# Patient Record
Sex: Female | Born: 1945 | Race: White | Hispanic: No | Smoking: Current some day smoker
Health system: Southern US, Community
[De-identification: ages and names within clinical notes are randomized; demographics above are authoritative.]

## PROBLEM LIST (undated history)

## (undated) DIAGNOSIS — D72829 Elevated white blood cell count, unspecified: Secondary | ICD-10-CM

## (undated) DIAGNOSIS — D62 Acute posthemorrhagic anemia: Secondary | ICD-10-CM

## (undated) DIAGNOSIS — S72142A Displaced intertrochanteric fracture of left femur, initial encounter for closed fracture: Secondary | ICD-10-CM

## (undated) DIAGNOSIS — N390 Urinary tract infection, site not specified: Secondary | ICD-10-CM

## (undated) DIAGNOSIS — J449 Chronic obstructive pulmonary disease, unspecified: Secondary | ICD-10-CM

## (undated) DIAGNOSIS — R918 Other nonspecific abnormal finding of lung field: Secondary | ICD-10-CM

## (undated) DIAGNOSIS — R000 Tachycardia, unspecified: Secondary | ICD-10-CM

## (undated) HISTORY — DX: Chronic obstructive pulmonary disease, unspecified: J44.9

## (undated) HISTORY — DX: Displaced intertrochanteric fracture of left femur, initial encounter for closed fracture: S72.142A

## (undated) HISTORY — DX: Urinary tract infection, site not specified: N39.0

## (undated) HISTORY — DX: Acute posthemorrhagic anemia: D62

## (undated) HISTORY — DX: Tachycardia, unspecified: R00.0

## (undated) HISTORY — DX: Elevated white blood cell count, unspecified: D72.829

---

## 1984-11-15 HISTORY — PX: TUBAL LIGATION: SHX77

## 1998-02-27 ENCOUNTER — Emergency Department (HOSPITAL_COMMUNITY): Admission: EM | Admit: 1998-02-27 | Discharge: 1998-02-27 | Payer: Self-pay | Admitting: Emergency Medicine

## 1998-03-17 ENCOUNTER — Emergency Department (HOSPITAL_COMMUNITY): Admission: EM | Admit: 1998-03-17 | Discharge: 1998-03-17 | Payer: Self-pay | Admitting: *Deleted

## 2001-02-24 ENCOUNTER — Encounter: Payer: Self-pay | Admitting: Family Medicine

## 2001-02-24 ENCOUNTER — Ambulatory Visit (HOSPITAL_COMMUNITY): Admission: RE | Admit: 2001-02-24 | Discharge: 2001-02-24 | Payer: Self-pay | Admitting: Family Medicine

## 2006-12-25 ENCOUNTER — Emergency Department (HOSPITAL_COMMUNITY): Admission: EM | Admit: 2006-12-25 | Discharge: 2006-12-25 | Payer: Self-pay | Admitting: Emergency Medicine

## 2011-10-19 ENCOUNTER — Other Ambulatory Visit: Payer: Self-pay | Admitting: Family Medicine

## 2011-10-19 DIAGNOSIS — R05 Cough: Secondary | ICD-10-CM

## 2011-10-20 ENCOUNTER — Ambulatory Visit
Admission: RE | Admit: 2011-10-20 | Discharge: 2011-10-20 | Disposition: A | Discharge status: Home / Self Care | Payer: Medicare Other | Source: Ambulatory Visit | Attending: Family Medicine | Admitting: Family Medicine

## 2011-10-20 DIAGNOSIS — R05 Cough: Secondary | ICD-10-CM

## 2011-10-20 MED ORDER — IOHEXOL 300 MG/ML  SOLN
75.0000 mL | Freq: Once | INTRAMUSCULAR | Status: AC | PRN
  Administered 2011-10-20: 75 mL via INTRAVENOUS

## 2011-10-28 ENCOUNTER — Other Ambulatory Visit: Payer: Self-pay

## 2011-10-28 ENCOUNTER — Encounter: Payer: Self-pay | Admitting: Internal Medicine

## 2011-10-28 ENCOUNTER — Encounter (HOSPITAL_COMMUNITY): Payer: Self-pay | Admitting: *Deleted

## 2011-10-28 ENCOUNTER — Emergency Department (HOSPITAL_COMMUNITY)
Admission: EM | Admit: 2011-10-28 | Discharge: 2011-10-28 | Disposition: A | Discharge status: Home / Self Care | Payer: Medicare Other | Attending: Emergency Medicine | Admitting: Emergency Medicine

## 2011-10-28 ENCOUNTER — Ambulatory Visit (INDEPENDENT_AMBULATORY_CARE_PROVIDER_SITE_OTHER): Payer: Medicare Other | Admitting: Internal Medicine

## 2011-10-28 VITALS — BP 110/62 | HR 103 | Temp 98.5°F | Ht 68.0 in | Wt 109.8 lb

## 2011-10-28 DIAGNOSIS — F17200 Nicotine dependence, unspecified, uncomplicated: Secondary | ICD-10-CM

## 2011-10-28 DIAGNOSIS — R64 Cachexia: Secondary | ICD-10-CM

## 2011-10-28 DIAGNOSIS — R0989 Other specified symptoms and signs involving the circulatory and respiratory systems: Secondary | ICD-10-CM

## 2011-10-28 DIAGNOSIS — R918 Other nonspecific abnormal finding of lung field: Secondary | ICD-10-CM

## 2011-10-28 DIAGNOSIS — J4489 Other specified chronic obstructive pulmonary disease: Secondary | ICD-10-CM | POA: Insufficient documentation

## 2011-10-28 DIAGNOSIS — R0600 Dyspnea, unspecified: Secondary | ICD-10-CM

## 2011-10-28 DIAGNOSIS — F411 Generalized anxiety disorder: Secondary | ICD-10-CM | POA: Insufficient documentation

## 2011-10-28 DIAGNOSIS — R222 Localized swelling, mass and lump, trunk: Secondary | ICD-10-CM

## 2011-10-28 DIAGNOSIS — R062 Wheezing: Secondary | ICD-10-CM | POA: Insufficient documentation

## 2011-10-28 DIAGNOSIS — R05 Cough: Secondary | ICD-10-CM

## 2011-10-28 DIAGNOSIS — R0789 Other chest pain: Secondary | ICD-10-CM

## 2011-10-28 DIAGNOSIS — R000 Tachycardia, unspecified: Secondary | ICD-10-CM

## 2011-10-28 DIAGNOSIS — F419 Anxiety disorder, unspecified: Secondary | ICD-10-CM

## 2011-10-28 DIAGNOSIS — J449 Chronic obstructive pulmonary disease, unspecified: Secondary | ICD-10-CM | POA: Insufficient documentation

## 2011-10-28 NOTE — ED Notes (Signed)
Report received from [facility], "pt was @ [facility] across the [address], had increased HR d/t albuterol, was informed by MD she may have cancer"

## 2011-10-28 NOTE — Patient Instructions (Addendum)
#  Chest lump  - please have PET scan asap  - will call daughter or sister with rsults and discuss next step #Cough and shortness of breath  - have breathing test full PFT at [facility] or [place] [facility] [place] as[facility] - have walking oxygen test today #Rapid heart beat due to albuterol - will review breathing test and then we can discuss med change at followup visit #Smoking  - will discuss at followup #Followup  - will call with results to discuss

## 2011-10-28 NOTE — ED Notes (Signed)
ZOX:[id number]<AV> Expected date:10/28/11<BR> Expected time: 4:10 PM<BR> Means of arrival:Ambulance<BR> Comments:<BR> EMS 51 GC, anxiety tachycardia

## 2011-10-28 NOTE — Progress Notes (Signed)
Subjective:    Patient ID: Karen Coffey, female    DOB: September 10, 1946, 65 y.o.   MRN: [id number]  HPI 65 year old female.  SMoker.  Aware of copd diagnosis since 2008 but not on Rx or fu for same.  No regular PMD. REferred by [facility] urgent care. Baseline chronic cough  Started having left infrascapular chest pains x 1 month. Improved by sitting up straight. Normally aching and mild when sitting still but radomly can get worse and become throbbing. Made worse when she lies down on the side. No relationship with inspiration or breathing. Severity is 2-3 of 10. Associated sensation of something moving around since starting levoflox and prednisone 10 days ago. Has completed course.   Chronic cough for several years is unchanged. RSI score is 17. [Has level for annoying cough. Has level III hoarseness of voice clearing of throat, coughing and lying down. Has level I excess mucus drip, difficulty swallowing liquids or tablets, choking episodes, and sensation of something in throat, and level 0 of  Heartburn]. Assoicated mild white spiutum +. Sputum gets more when she uses inhaler that was started recently. Associated weight loss + ? 20#. Body mass index is 16.70 kg/(m^2).   Above symptoms went to urgent care and resulted in CT chest 10/20/11 : that showed Low left internal jugular lymph node measures 9 mm. Left perihilar mass measures 8.2 x 6.2 cm and is contiguous with bulky adenopathy in the AP window and prevascular space. 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 Heart size  normal. No pericardial effusion. Emphysema. There is obstruction of the left upper lobe bronchus,  with postobstructive pneumonitis and consolidation. Pleural nodularity is seen in the posterior left hemithorax. Scarring in the posterior right upper lobe. No pleural fluid.  She is aware this is tumor and says she does not want to know what it is but  says "I do not want to think that or the worst". When I prompted her, she said "it is the C word". She says she does not want to see the image. She prefers daughter [name] and sister [name] be given diagnosis after biopsy. She clearly does not want to know prognosis. Displaced extreme anxiety about testing even PET scan  SOCIAL Daughter [name] and sister [name] will be first lines of communication. She is close to her son [name] who was in [place]. This another daughter [name] who is not in communication with family for 2 years.  Past Medical History  Diagnosis Date  . COPD (chronic obstructive pulmonary disease)   . Pleurisy 2008    left lung     Family History  Problem Relation Age of Onset  . Asthma    . Heart disease Mother   . Cancer Father   . Pancreatic cancer       History   Social History  . Marital Status: Divorced    Spouse Name: N/A    Number of Children: N/A  . Years of Education: N/A   Occupational History  . Not on file.   Social History Main Topics  . Smoking status: Current Everyday Smoker -- 1.0 packs/day for 50 years    Types: Cigarettes  . Smokeless tobacco: Never Used   Comment: currently down to 7 cigs a day  . Alcohol Use: No  . Drug Use: No  . Sexually Active: Not on file   Other Topics Concern  . Not on file   Social  History Narrative  . No narrative on file     No Known Allergies   No outpatient prescriptions prior to visit.     Review of Systems  Constitutional: Positive for appetite change and unexpected weight change. Negative for fever.  HENT: Positive for congestion and trouble swallowing. Negative for ear pain, nosebleeds, sore throat, rhinorrhea, sneezing, dental problem, postnasal drip and sinus pressure.   Eyes: Negative for redness and itching.  Respiratory: Positive for cough and shortness of breath. Negative for chest tightness and wheezing.   Cardiovascular: Positive for palpitations. Negative for leg swelling.    Gastrointestinal: Negative for nausea and vomiting.  Genitourinary: Negative for dysuria.  Musculoskeletal: Negative for joint swelling.  Skin: Negative for rash.  Neurological: Negative for headaches.  Hematological: Does not bruise/bleed easily.  Psychiatric/Behavioral: Positive for dysphoric mood. The patient is nervous/anxious.        Objective:   Physical Exam  Vitals reviewed. Constitutional: She is oriented to person, place, and time. She appears well-nourished. No distress.       Body mass index is 16.70 kg/(m^2).   HENT:  Head: Normocephalic and atraumatic.  Right Ear: External ear normal.  Left Ear: External ear normal.  Mouth/Throat: Oropharynx is clear and moist. No oropharyngeal exudate.  Eyes: Conjunctivae and EOM are normal. Pupils are equal, round, and reactive to light. Right eye exhibits no discharge. Left eye exhibits no discharge. No scleral icterus.  Neck: Normal range of motion. Neck supple. No JVD present. No tracheal deviation present. No thyromegaly present.  Cardiovascular: Normal rate, regular rhythm, normal heart sounds and intact distal pulses.  Exam reveals no gallop and no friction rub.   No murmur heard.      Got tachycardic to 160 after visit while gtting pet scan organized and sent to ER  Pulmonary/Chest: Effort normal and breath sounds normal. No respiratory distress. She has no wheezes. She has no rales. She exhibits no tenderness.  Abdominal: Soft. Bowel sounds are normal. She exhibits no distension and no mass. There is no tenderness. There is no rebound and no guarding.  Musculoskeletal: Normal range of motion. She exhibits no edema and no tenderness.  Lymphadenopathy:    She has no cervical adenopathy.  Neurological: She is alert and oriented to person, place, and time. She has normal reflexes. No cranial nerve deficit. She exhibits normal muscle tone. Coordination normal.  Skin: Skin is warm and dry. No rash noted. She is not diaphoretic. No  erythema. No pallor.  Psychiatric: Judgment and thought content normal.       Highly anxious          Assessment & Plan:

## 2011-10-29 ENCOUNTER — Telehealth: Payer: Self-pay | Admitting: Internal Medicine

## 2011-10-29 MED ORDER — LEVALBUTEROL TARTRATE 45 MCG/ACT IN AERO: 1.0000 | INHALATION_SPRAY | RESPIRATORY_TRACT | Status: DC | PRN

## 2011-10-29 MED ORDER — ALPRAZOLAM 0.25 MG PO TABS: ORAL_TABLET | ORAL | Status: DC

## 2011-10-29 MED ORDER — ZOLPIDEM TARTRATE 5 MG PO TABS: ORAL_TABLET | ORAL | Status: DC

## 2011-10-29 NOTE — ED Provider Notes (Signed)
Medical screening examination/treatment/procedure(s) were performed by non-physician practitioner and as supervising physician I was immediately available for consultation/collaboration.   Daveon Arpino A. Patrica Duel, MD 10/29/11 [phone]

## 2011-10-29 NOTE — Telephone Encounter (Signed)
Daughter called and stated that the pt requested that she call---pt stated that she is having severe pain in her back--requesting something of a low dose.  Pt was unable to sleep last night due to the pain--pain is worse at night.  She was given muscle relaxer but this does not help.   Pt is also afraid of having the PET scan next week--she is afraid that she will have to be in the scanner for a long time and this just makes the pt freak out.  Daughter is wanting to know if the pt can have something to take prior to the PET scan to calm her down. MR   Please advise. thanks

## 2011-10-29 NOTE — Telephone Encounter (Signed)
Ok if opioids have tendency to make her loopy let her know that ibuprofen 400-600mg  three times daily prn .  And for stomach irritiation associated with that take OTC prilosec 20mg  with it. There is bony and possibly nerve root irritation so need something strong. If this does not work then call back and next several days we can try neurotin which takes days to work

## 2011-10-29 NOTE — Telephone Encounter (Signed)
Called and spoke with Karen Coffey and she is aware of MR recs at this time. Will try the ibuprofen 400-600mg  tid prn and take the prilosec 20mg .  She is aware to call back if this does not seem to be helping and we can try the neurontin.

## 2011-10-29 NOTE — ED Provider Notes (Signed)
History     CSN: [id number] Arrival date & time: 10/28/2011  4:19 PM   First MD Initiated Contact with Patient 10/28/11 1759      Chief Complaint  Patient presents with  . Tachycardia    pt was at drs office and pts HR went to 168. Pt reports that she has been on albuterol. pt denies other complaints.     (Consider location/radiation/quality/duration/timing/severity/associated sxs/prior treatment) Patient is a 65 y.o. female presenting with palpitations. The history is provided by the patient.  Palpitations  The current episode started 1 to 2 hours ago. The problem occurs constantly. Pertinent negatives include no diaphoresis, no fever, no chest pain, no irregular heartbeat, no dizziness and no cough.  Pt states she was at her pulmonologist's office when they were takling about getting a PET scan to evaluate for cancer. States that she became very anxious because she was worried about her having cancer and because she is claustrophobic and does not want to be in a scanner. States they noted her to have HR in 160s, and it did not come down in 20 min so she was transported to ER by ambulance. States by the time she got here, her symptoms resolved. She denied chest pain, SOB, dizziness at any point during event. No current complaints.  Past Medical History  Diagnosis Date  . COPD (chronic obstructive pulmonary disease)   . Pleurisy 2008    left lung    Past Surgical History  Procedure Date  . Tubal ligation 1986    Family History  Problem Relation Age of Onset  . Asthma    . Heart disease Mother   . Cancer Father   . Pancreatic cancer      History  Substance Use Topics  . Smoking status: Current Everyday Smoker -- 1.0 packs/day for 50 years    Types: Cigarettes  . Smokeless tobacco: Never Used   Comment: currently down to 7 cigs a day  . Alcohol Use: No    OB History    Grav Para Term Preterm Abortions TAB SAB Ect Mult Living                  Review of Systems    Constitutional: Negative for fever and diaphoresis.  HENT: Negative.   Respiratory: Negative for cough and chest tightness.   Cardiovascular: Positive for palpitations. Negative for chest pain and leg swelling.  Gastrointestinal: Negative.   Genitourinary: Negative.   Musculoskeletal: Negative.   Skin: Negative.   Neurological: Negative.  Negative for dizziness.  Psychiatric/Behavioral: Negative.     Allergies  Review of patient's allergies indicates no known allergies.  Home Medications   Current Outpatient Rx  Name Route Sig Dispense Refill  . ALBUTEROL SULFATE HFA 108 (90 BASE) MCG/ACT IN AERS Inhalation Inhale 2 puffs into the lungs every 6 (six) hours as needed. For shortness of breath.    . ALBUTEROL SULFATE (2.5 MG/3ML) 0.083% IN NEBU Nebulization Take 2.5 mg by nebulization every 6 (six) hours as needed. For shortness of breath.      BP 122/62  Pulse 97  Temp(Src) 97.9 F (36.6 C) (Oral)  Resp 18  Wt 109 lb (49.442 kg)  SpO2 96%  Physical Exam  Nursing note and vitals reviewed. Constitutional: She is oriented to person, place, and time. She appears well-developed and well-nourished. No distress.  HENT:  Head: Normocephalic and atraumatic.  Eyes: Conjunctivae are normal.  Neck: Neck supple.  Cardiovascular: Normal rate, regular rhythm and normal  heart sounds.   Pulmonary/Chest: Effort normal. No respiratory distress. She has wheezes. She has rales.  Abdominal: Soft. There is no tenderness.  Musculoskeletal: Normal range of motion. She exhibits no edema.  Neurological: She is alert and oriented to person, place, and time.  Skin: Skin is warm and dry. She is not diaphoretic. No erythema.  Psychiatric: She has a normal mood and affect.    ED Course  Procedures (including critical care time)  Pt with tachycardia which is now resolved, HR here 99. Pt has no complaints. Pt is here with her daughter, who both state pt was very anxious. Pt states she is feeling  better and does not want to stay for testing. ECG ordered, which shows NS. Pt asked to be discharged. Will d/c home. Explained to her risks of leaving without any blood tests or any other evaluation and monitoring. Pt voiced her understanding and states she would return if symptoms return. Will d/c home  1. Tachycardia   2. Anxiety      Date: 10/29/2011  Rate: 103  Rhythm: sinus tachycardia  QRS Axis: right  Intervals: normal  ST/T Wave abnormalities: nonspecific T wave changes  Conduction Disutrbances:none  Narrative Interpretation:   Old EKG Reviewed: changes noted new tachycarida    MDM          Lottie Mussel, PA 10/29/11 0216  Lottie Mussel, PA 10/29/11 1610

## 2011-10-29 NOTE — Telephone Encounter (Signed)
Please tell her not to take albuterol. Send xopenex script mdi  X 1. IF expensive let us know. This is for tachycardia  For pain: I thought she has percocet ? Not sure. LEt me know what she is taking for pain. Will definitely need an opioid. Let me know  Regarding pet scan anxiety: take ambien 5mg  night before PET scan and xanax 0.25mg   X 1on morning while going to PET scan

## 2011-10-29 NOTE — Telephone Encounter (Signed)
Daughter is aware and says her mother does not have anything for pain. She prefers not to use Percocet but will try something lose dose for pain. These type of medications make her loopy.

## 2011-11-01 ENCOUNTER — Ambulatory Visit (HOSPITAL_COMMUNITY)
Admission: RE | Admit: 2011-11-01 | Discharge: 2011-11-01 | Disposition: A | Discharge status: Home / Self Care | Payer: Medicare Other | Source: Ambulatory Visit | Attending: Internal Medicine | Admitting: Internal Medicine

## 2011-11-01 ENCOUNTER — Telehealth: Payer: Self-pay | Admitting: Internal Medicine

## 2011-11-01 DIAGNOSIS — R222 Localized swelling, mass and lump, trunk: Secondary | ICD-10-CM | POA: Insufficient documentation

## 2011-11-01 DIAGNOSIS — F17200 Nicotine dependence, unspecified, uncomplicated: Secondary | ICD-10-CM | POA: Insufficient documentation

## 2011-11-01 DIAGNOSIS — J988 Other specified respiratory disorders: Secondary | ICD-10-CM | POA: Insufficient documentation

## 2011-11-01 DIAGNOSIS — R918 Other nonspecific abnormal finding of lung field: Secondary | ICD-10-CM

## 2011-11-01 LAB — PULMONARY FUNCTION TEST

## 2011-11-01 MED ORDER — GABAPENTIN 300 MG PO CAPS: ORAL_CAPSULE | ORAL | Status: DC

## 2011-11-01 MED ORDER — TRAMADOL HCL 50 MG PO TABS: 50.0000 mg | ORAL_TABLET | Freq: Three times a day (TID) | ORAL | Status: AC | PRN

## 2011-11-01 NOTE — Telephone Encounter (Signed)
I spoke with Karen Coffey and she states that pt tried the ibuprofen and it did not help with her back pain and is requesting an alternative. I called MR and he stated 1) offer neurotin 300 mg: 300 mg qd x 3 days, 300 mg BID x 3 days, 300 mg TID  -Can make you sleepy and it takes a few days for it to act.  2) tramadol 50 mg 1 q 8 hrs 3) Also advise them w/o opoid's it will be difficult to control her pain and to consider this.  I advised Karen Coffey of MR's recs. She voiced her understanding and states they will try the tramadol and neurotin to see if this will help with her pain. She would like rx's sent to [facility] on [address]. Rx's have been sent and Karen Coffey is aware of the directions and voiced her understanding and had no questions.

## 2011-11-02 ENCOUNTER — Encounter: Payer: Self-pay | Admitting: Internal Medicine

## 2011-11-02 DIAGNOSIS — F17200 Nicotine dependence, unspecified, uncomplicated: Secondary | ICD-10-CM | POA: Insufficient documentation

## 2011-11-02 DIAGNOSIS — R918 Other nonspecific abnormal finding of lung field: Secondary | ICD-10-CM | POA: Insufficient documentation

## 2011-11-02 DIAGNOSIS — R0600 Dyspnea, unspecified: Secondary | ICD-10-CM | POA: Insufficient documentation

## 2011-11-02 DIAGNOSIS — R0789 Other chest pain: Secondary | ICD-10-CM | POA: Insufficient documentation

## 2011-11-02 DIAGNOSIS — R64 Cachexia: Secondary | ICD-10-CM | POA: Insufficient documentation

## 2011-11-02 DIAGNOSIS — R000 Tachycardia, unspecified: Secondary | ICD-10-CM | POA: Insufficient documentation

## 2011-11-02 DIAGNOSIS — R05 Cough: Secondary | ICD-10-CM | POA: Insufficient documentation

## 2011-11-02 NOTE — Assessment & Plan Note (Signed)
Due to COPD and lung mass. We'll do PFTs 

## 2011-11-02 NOTE — Assessment & Plan Note (Signed)
This is a bad prognostic sign and suspected lung cancer and COPD because her body mass index is less than 17

## 2011-11-02 NOTE — Assessment & Plan Note (Signed)
She acknowledges the need to quit smoking. We'll address in detail at followup right now brief counseling given

## 2011-11-02 NOTE — Assessment & Plan Note (Signed)
Due to COPD and lung mass. We'll do PFTs

## 2011-11-02 NOTE — Assessment & Plan Note (Signed)
This is due to the tumor.  try treating this with nonsteroidal anti-inflammatory drugs initially

## 2011-11-02 NOTE — Assessment & Plan Note (Signed)
Initially suspected due to albuterol. At the end of the visit also scheduling a PET scan her heart rate rose to 160 following a "panic attack". It remained sustained at 160 per minute for at least 10 minutes in the office. We gave her a baby aspirin. Called emergency services and sent her to [facility] emergency room

## 2011-11-02 NOTE — Assessment & Plan Note (Signed)
#  Chest lump  - please have PET scan asap  - will call daughter or sister with rsults and discuss next step

## 2011-11-04 ENCOUNTER — Encounter (HOSPITAL_COMMUNITY): Payer: Self-pay

## 2011-11-04 ENCOUNTER — Encounter (HOSPITAL_COMMUNITY)
Admission: RE | Admit: 2011-11-04 | Discharge: 2011-11-04 | Disposition: A | Discharge status: Home / Self Care | Payer: Medicare Other | Source: Ambulatory Visit | Attending: Internal Medicine | Admitting: Internal Medicine

## 2011-11-04 DIAGNOSIS — R918 Other nonspecific abnormal finding of lung field: Secondary | ICD-10-CM

## 2011-11-04 DIAGNOSIS — R222 Localized swelling, mass and lump, trunk: Secondary | ICD-10-CM | POA: Insufficient documentation

## 2011-11-04 DIAGNOSIS — J984 Other disorders of lung: Secondary | ICD-10-CM | POA: Insufficient documentation

## 2011-11-04 HISTORY — DX: Other nonspecific abnormal finding of lung field: R91.8

## 2011-11-04 LAB — GLUCOSE, CAPILLARY: Glucose-Capillary: 104 mg/dL — ABNORMAL HIGH (ref 70–99)

## 2011-11-04 MED ORDER — FLUDEOXYGLUCOSE F - 18 (FDG) INJECTION
19.9000 | Freq: Once | INTRAVENOUS | Status: AC | PRN
  Administered 2011-11-04: 19.9 via INTRAVENOUS

## 2011-11-05 ENCOUNTER — Telehealth: Payer: Self-pay | Admitting: Internal Medicine

## 2011-11-05 DIAGNOSIS — R0600 Dyspnea, unspecified: Secondary | ICD-10-CM

## 2011-11-05 DIAGNOSIS — R918 Other nonspecific abnormal finding of lung field: Secondary | ICD-10-CM

## 2011-11-05 NOTE — Telephone Encounter (Signed)
I spoke with MR about this patient and he states is going to speak witht he radiologist to see which place they are going to biopsy and he will call the family later today. He has not reviewed ONO results yet. I have LMTCBx1 to advise the pt family. Carron Curie, CMA

## 2011-11-05 NOTE — Telephone Encounter (Signed)
REviewe PET scan: there is activity at many places. So, many places that can be biopsied. So, spoke to Dr Grace Isaac of IR. He feels suprclavicular node is best place to biopsy especially left side (see report below). Called dtr Glendon Axe had to [facility]. Called patient [phone] but no response. Called patient on both her number but no response. LMTCB  Please set up for node bx and fu with me. She will ask something for anxiety and you can give the same thing given for PET scan which I think was the ambien night before and xanax 1h before  For O2, pls check my desk and maybe have an MD review or she has to wait until after xmas. I am inbox xmas day and if quiet will be reviewing   Nm Pet Image Initial (pi) Skull Base To Thigh  11/04/2011  *RADIOLOGY REPORT*  Clinical Data:  Initial treatment strategy for lung mass.  NUCLEAR MEDICINE PET CT INITIAL (PI) SKULL BASE TO THIGH  Technique:  19.9 mCi F-18 FDG was injected intravenously via the left antecubital fossa.  Full-ring PET imaging was performed from the skull base through the mid-thighs 75  minutes after injection. CT data was obtained and used for attenuation correction and anatomic localization only.  (This was not acquired as a diagnostic CT examination.)  Fasting Blood Glucose:  104  Patient Weight:  109 pounds.  Comparison:  CT chest 10/20/2011 and 12/25/2006.  Findings: Low left internal jugular adenopathy measures up to 1.5 cm with an S U V max of 11.4.  There are two hypermetabolic left axillary lymph nodes, measuring up to 12 mm (previously 7 mm). Prevascular nodal mass is contiguous with a large left upper lobe mass.  S U V max is 13.4.  Large left upper lobe mass is better measured on 10/20/2011 and has an S U V max of 14.2.  There are pleural nodules in the left hemithorax, with an S U V max of 8.0. No additional areas of abnormal hypermetabolism in the neck, chest, abdomen or pelvis.  CT images were performed for attenuation correction.  No  additional acute findings in the neck. Interpretation of CT images of the chest will be deferred to diagnostic examination performed 10/20/2011.  No intervening pericardial or pleural effusion.  No acute findings in the abdomen or pelvis.  IMPRESSION: Hypermetabolic large left upper lobe mass with associated pleural nodularity, mediastinal invasion and adenopathy extending to the left supraclavicular region, most consistent with primary bronchogenic carcinoma.  If this is a non-small cell lung cancer, it is most consistent with [id number] or stage IV disease.  Original Report Authenticated By: Reyes Ivan, M.D.

## 2011-11-05 NOTE — Telephone Encounter (Signed)
Long 20 mnutes conversatiion with dtr.    - Lung mass: she and her mom anxious. Lot of social pressure on "why so long" and why biopsy when it already likely cancer. Explained all of above. AFter that she does not want outside referral to [facility]. She will pursue care here though I offered her to refer to [facility]. She understand rationale for bx.   Please set  Up the supraclav bx of PET HOT LEFT lyymph node ASAP  - OXYGEN: waking up dyspneic. They want o2 asap,. Please call me now or page me with the ONO result and we will do order so they can have o2 asap

## 2011-11-05 NOTE — Telephone Encounter (Signed)
I called and spoke with MR and gave ONO results. Per ONO report Saturation time less then or equal to 88% is 1.81mins so per medicare guidelines the pt does not qualify for oxygen at night time. She does however qualify for oxygen with exertion per walk test done at time of OV.  I called and relayed this to the pt daughter and she is ok to order this. I placed order for o2 with exertion and biopsy order as well. Carron Curie, CMA

## 2011-11-05 NOTE — Telephone Encounter (Signed)
Pt daughter called back and I advised per below and she had more questions so I advised I will have MR call the daughter back. Carron Curie, CMA

## 2011-11-10 ENCOUNTER — Telehealth: Payer: Self-pay | Admitting: Internal Medicine

## 2011-11-10 NOTE — Telephone Encounter (Signed)
lmomtcb for angel

## 2011-11-10 NOTE — Telephone Encounter (Signed)
Pt's daughter says that the pt cancelled the order for portable O2 with [facility] and did not mean to do this. I spoke [place] at [facility] and they will need [place] to refax the order because they did not receive this. Order refaxed to [phone].

## 2011-11-12 ENCOUNTER — Other Ambulatory Visit: Payer: Self-pay | Admitting: Radiology

## 2011-11-12 ENCOUNTER — Telehealth: Payer: Self-pay | Admitting: Internal Medicine

## 2011-11-12 NOTE — Telephone Encounter (Signed)
Needs complete evaluation - to er

## 2011-11-12 NOTE — Telephone Encounter (Signed)
Spoke with [employer] and notified of recs per RA. She verbalized understanding and will inform the pt.

## 2011-11-12 NOTE — Telephone Encounter (Signed)
Called spoke with patient's daughter, [name] who asked if patient has any refills on her xopenex hfa.  Refilled #1 with 1 refill on 12.14.12 - asked if patient needs the refill already?  Yes per [name] > pt's o2 was not delivered until yesterday and has been having DOE to the point where patient passed out yesterday evening walking back from her bedroom.  [name] stated that pt returned to the living room, asked for her o2 (was written for exertion 2L), sat in the chair and her head lolled back, eyes rolled into the back of her head.  Per [name], they began mouth to mouth (pt's heart did not stop per [name]) and EMS was called.  Pt came to shortly after, and by the time EMS arrived all her VS were normal including her BP, BS, and o2 sats.  When EMS offered to take pt to the ED she declined.  Per [name], pt wore her o2 last night and all day today and reported that she does feel better today but still feels "off."  I advised [name] that if patient's SOB is to that point she should take patient to the ER or at least an UC but [name] is requesting to increase pt's o2 liter flow and any additional recs.  Advised her to have patient stay seated/resting, wear the o2 continuously and to have her do pursed-lip breathing.    Dr Vassie Loll please advise, thanks.

## 2011-11-13 ENCOUNTER — Telehealth: Payer: Self-pay | Admitting: Pulmonary Disease

## 2011-11-13 NOTE — Telephone Encounter (Signed)
Called by pt's daughter requesting refills on pt's albuterol neb solution.  Pt was taken off this by Dr. Marchelle Gearing, and placed on xopenex hfa for rescue due to tachy.  I have reviewed records, and there was also a phone call 12/28 requesting xopenex refills in a short period of time.  She was felt to be overusing her rescue med, and felt she needed evaluation in ER.  I told the daughter today that I would not fill her albuterol neb solution, and that if the pt was having a lot of issues with breathing, she needed to go to ER for evaluation.  Daughter became upset, and when she called me back again called me an "asshole" and hung up.

## 2011-11-15 ENCOUNTER — Telehealth: Payer: Self-pay | Admitting: Internal Medicine

## 2011-11-15 NOTE — Telephone Encounter (Signed)
LMOM TCB x1.  WCB d/t content of the message.

## 2011-11-15 NOTE — Telephone Encounter (Signed)
PT'S DAUGHTER ANGEL CALLED BACK AGAIN- SAYS IT'S "VERY IMPORTANT" THAT NURSE CALL BACK ASAP. Hazel Sams

## 2011-11-15 NOTE — Telephone Encounter (Signed)
Called spoke with [name] who stated that pt is in some extreme pain in her rib cage and back on the left side x5days, progressively getting worse.  Per 12.17.12 phone note, has been taking gabapentin as directed and the tramadol "made her hurt more."  Has been using ice packs that offer slight relief.  Per the 12.17.12, MR offered opoids, percocet per [name] > she prefer pt not take this but is okay with it if "it's the only thing that will work."  Pt is weak and only getting up to use the bathroom.  I did advise [name] that if pt is in that much pain then the ER is the best option for her.  Angel informed me that the patient does not want to go to the hosp for fear that "they will want to keep her there and she'll die there."  Advised that I will be happy to call the patient and speak with her to encourage her that the ER is best right now, but [name] wants a physicians recs first.  Called MR, advised of pt's status per [place].  Per MR, the best option is to have an admission for pain management at [facility].  Pt's biopsy is scheduled for 1.2.13 - why was it scheduled for 2 weeks out?  Please find out.  Called spoke with [name], advised of MR's recs.  [name] verbalized her understanding.  I asked for pt's number to speak with her directly > [phone].  Called spoke with patient who reported that her pain "is not that bad."  Pt stated that she has sporadic "throbbing" in her back and that it happens occasionally after taking the tramadol.  I asked pt to rate this pain on a scale of 0-10 but pt stated that she can't do that, the pain is an "aggravating pain."  Pt reported that ice and heat help and that she is actually feeling better yesterday and today.  Has been drinking ensure, getting up and sitting in front of the door/window looking outside and getting up to eat.  Pt stated that she told her daughter that she knows what to do if the pain gets severe > "call 911 and get them to come and get her."  I advised pt  that yes, the ER would be the best place for her to help her with her pain management.  Pt stated that she did opt to have her biopsy on 1.2.13, that date was offered to her.  Spoke with MR, informed him of my findings.  MR okay with this - will speak with another practitioner in office about giving pt biopsy results so that she will not have to wait another week for an appt with him.  Spoke with [name], informed her of my conversation with her mother and that MR is aware of such.  [name] stated that she feels like the patient is down-playing her symptoms and that she vacillates from the extreme pain to "it doesn't hurt that bad."  Dorris Carnes that if the pain becomes extreme again to encourage pt to go to the ER and remind her of her conversation with me and MR's recs.  [name] was okay with this and verbalized her understanding.  I also informed [name] that we will try to get pt in this week to discuss her biopsy results so that she will not have to wait another week for the results - [name] okay with this as well and verbalized her understanding.

## 2011-11-17 ENCOUNTER — Ambulatory Visit (HOSPITAL_COMMUNITY)
Admission: RE | Admit: 2011-11-17 | Discharge: 2011-11-17 | Disposition: A | Discharge status: Home / Self Care | Payer: Medicare Other | Source: Ambulatory Visit | Attending: Internal Medicine | Admitting: Internal Medicine

## 2011-11-17 ENCOUNTER — Telehealth: Payer: Self-pay | Admitting: Internal Medicine

## 2011-11-17 DIAGNOSIS — F17200 Nicotine dependence, unspecified, uncomplicated: Secondary | ICD-10-CM | POA: Insufficient documentation

## 2011-11-17 DIAGNOSIS — J4489 Other specified chronic obstructive pulmonary disease: Secondary | ICD-10-CM | POA: Insufficient documentation

## 2011-11-17 DIAGNOSIS — R222 Localized swelling, mass and lump, trunk: Secondary | ICD-10-CM | POA: Insufficient documentation

## 2011-11-17 DIAGNOSIS — R599 Enlarged lymph nodes, unspecified: Secondary | ICD-10-CM | POA: Insufficient documentation

## 2011-11-17 DIAGNOSIS — J449 Chronic obstructive pulmonary disease, unspecified: Secondary | ICD-10-CM | POA: Insufficient documentation

## 2011-11-17 DIAGNOSIS — R918 Other nonspecific abnormal finding of lung field: Secondary | ICD-10-CM

## 2011-11-17 LAB — APTT: aPTT: 32 seconds (ref 24–37)

## 2011-11-17 LAB — PROTIME-INR
INR: 0.98 (ref 0.00–1.49)
Prothrombin Time: 13.2 seconds (ref 11.6–15.2)

## 2011-11-17 LAB — CBC
MCH: 29.6 pg (ref 26.0–34.0)
MCHC: 34.8 g/dL (ref 30.0–36.0)
Platelets: 413 10*3/uL — ABNORMAL HIGH (ref 150–400)
RDW: 12.2 % (ref 11.5–15.5)

## 2011-11-17 MED ORDER — FENTANYL CITRATE 0.05 MG/ML IJ SOLN
INTRAMUSCULAR | Status: AC
  Filled 2011-11-17: qty 4

## 2011-11-17 MED ORDER — SODIUM CHLORIDE 0.9 % IV SOLN: INTRAVENOUS | Status: DC

## 2011-11-17 MED ORDER — MIDAZOLAM HCL 2 MG/2ML IJ SOLN
INTRAMUSCULAR | Status: AC
  Filled 2011-11-17: qty 4

## 2011-11-17 NOTE — H&P (Signed)
Karen Coffey is an 66 y.o. female.   Chief Complaint: " I' m here for a biopsy" HPI: Patient with history of left lung mass with left supraclavicular adenopathy presents today for elective US guided left supraclavicular lymph node biopsy.  Past Medical History  Diagnosis Date  . COPD (chronic obstructive pulmonary disease)   . Pleurisy 2008    left lung  . Lung mass     Past Surgical History  Procedure Date  . Tubal ligation 1986    Family History  Problem Relation Age of Onset  . Asthma    . Heart disease Mother   . Cancer Father   . Pancreatic cancer     Social History:  reports that she has been smoking Cigarettes.  She has a 50 pack-year smoking history. She has never used smokeless tobacco. She reports that she does not drink alcohol or use illicit drugs.  Allergies:  Allergies  Allergen Reactions  . Albuterol Sulfate     tachycardia    Medications Prior to Admission  Medication Sig Dispense Refill  . ALPRAZolam (XANAX) 0.25 MG tablet Take 1 tablet the morning of procedure, 1 hour prior to procedure  1 tablet  0  . gabapentin (NEURONTIN) 300 MG capsule Take 300 mg once a day x 3 days, then 300 mg twice a day x 3 days, then 300 mg 3 times a day. May make you sleepy  225 capsule  0  . levalbuterol (XOPENEX HFA) 45 MCG/ACT inhaler Inhale 1-2 puffs into the lungs every 4 (four) hours as needed for wheezing.  1 Inhaler  1  . zolpidem (AMBIEN) 5 MG tablet Take 1 tablet the night before procedure  1 tablet  0   Medications Prior to Admission  Medication Dose Route Frequency Provider Last Rate Last Dose  . 0.9 %  sodium chloride infusion   Intravenous Continuous Robet Leu, PA       Results for orders placed during the hospital encounter of 11/17/11  CBC      Component Value Range   WBC 8.2  4.0 - 10.5 (K/uL)   RBC 4.29  3.87 - 5.11 (MIL/uL)   Hemoglobin 12.7  12.0 - 15.0 (g/dL)   HCT 16.1  09.6 - 04.5 (%)   MCV 85.1  78.0 - 100.0 (fL)   MCH 29.6  26.0 - 34.0  (pg)   MCHC 34.8  30.0 - 36.0 (g/dL)   RDW 40.9  81.1 - 91.4 (%)   Platelets 413 (*) 150 - 400 (K/uL)      Review of Systems  Constitutional: Negative for fever and chills.  Respiratory: Positive for cough and shortness of breath.   Cardiovascular: Positive for chest pain.       Occ left sided chest discomfort  Gastrointestinal: Negative for nausea, vomiting and abdominal pain.  Neurological: Negative for headaches.  Endo/Heme/Allergies: Does not bruise/bleed easily.  Vitals:  BP 123/68  HR  82  O2 SATS 99% 2 LITERS N/C  TEMP 97.8  Physical Exam  Constitutional: She is oriented to person, place, and time. She appears well-developed and well-nourished.  Cardiovascular: Normal rate and regular rhythm.   Respiratory: Effort normal.       Decreased BS left lung  GI: Soft. Bowel sounds are normal. She exhibits no distension. There is no tenderness.  Musculoskeletal: Normal range of motion.       Trace edema bilat  Neurological: She is alert and oriented to person, place, and time.  Assessment/Plan Patient with left lung mass and enlarged left supraclavicular lymph node; plan is for US guided left supraclavicular lymph node biopsy.  Ejay Lashley,D KEVIN 11/17/2011, 1:38 PM

## 2011-11-17 NOTE — Telephone Encounter (Signed)
lmomtcb x1 

## 2011-11-17 NOTE — Procedures (Signed)
Ultrasound guided left supraclavicular lymph node biopsy.  5 FNAs.  No immediate complication.

## 2011-11-17 NOTE — Telephone Encounter (Signed)
lmomtcb  

## 2011-11-17 NOTE — Telephone Encounter (Signed)
Pt's daughter returned triage's call.  Stated she was on the other line w/ the hospital & could not "click over".  Karen Coffey

## 2011-11-17 NOTE — Telephone Encounter (Signed)
Xanax ok

## 2011-11-17 NOTE — Telephone Encounter (Signed)
Angel returned call with 2 questions... 1- Angel went to pick up rx for pt's xopenex and was given albuterol HFA instead.  [name] wanted to be sure that xopenex was sent as pt has adverse reaction to the albuterol with tachycardia.  I advised [name] that yes, xopenex was prescribed.  Albuterol added to allergies list.  2- per 12.14.12 phone note, MR okayed for xanax 0.25mg  x1 to be taken before her PET for anxiety.  [name] is requesting this again d/t nerves for the biopsy scheduled today @ 1130.  MR not in office today, will forward to doc of the day.  Dr Delford Field please advise, thanks.

## 2011-11-18 ENCOUNTER — Telehealth: Payer: Self-pay | Admitting: *Deleted

## 2011-11-18 NOTE — H&P (Signed)
Agree with PA note. 

## 2011-11-18 NOTE — Telephone Encounter (Signed)
I spoke with [name] and she states pt's biopsy was fine and did not have anxiety. [name] states though pt's pain medication Gabapentin and tramadol does not seem to be helping her pain. She states this actually causes her pain to be worse. I have called MR and he stated he will call me back in 5 minutes. Will await his call

## 2011-11-18 NOTE — Telephone Encounter (Signed)
I spoke with MR and he stated that pt needed to go to [facility] ER to be admitted. Per MR pt's oxygen levels desat'd during biopsy. When I spoke with [name] and advised her of this she was not happy. She was upset and did not understanding bc they were advised after the biopsy things went well. She wanted MR to call her. I called and spoke with MR and he stated he will call her and he given angel's #. Nothing further was needed

## 2011-11-18 NOTE — Telephone Encounter (Signed)
I spoke with [name] and she is aware pt is scheduled to come in and see TP tomorrow at 2:00. She voiced her understanding. The other phone note was accidentally closed. Will forward to MR so he can document his conversation with [name] per his request.

## 2011-11-18 NOTE — Telephone Encounter (Signed)
D/w Dtr [name]  1. Pain control not responding to step 1 and step 2 pain mgmt. Dtr has expressed significant concern in past about mom tolerance and possible adr to opioids. She is worried that opioids might adversely affect her CNS status. Explained that opioids are next step in mgmt in likely cancer pain. And advised admisstiion. She was concerned that admission meant patient will never return home. Expressed that goal is to cotnrol pain and monitor for ADRs with opioids and even get specialist help with pain and best done in inpatient setting. Goal is to get her back home with improved quality of life but uncertainties always there. She agreed for admission but wants mom seein in office tomorrow. Does not want to go to ER today. D/w NP Tammy Parrett and she has agreed to see patient tomorrow  2. Bx diagnosis -hopefully be ready tomorrow. NP Tammy Parrett and I discussed and she is ok with giving patient diagnosis (actually dtr is to be given diganosis because patient does not want to hear about it per prior conversation). If admitted tomorrow, will get onc involved as inpatient  Thanks  MR  Cc to [facility]

## 2011-11-19 ENCOUNTER — Ambulatory Visit: Payer: Medicare Other | Admitting: Adult Health

## 2011-11-19 ENCOUNTER — Telehealth: Payer: Self-pay | Admitting: Adult Health

## 2011-11-19 DIAGNOSIS — R918 Other nonspecific abnormal finding of lung field: Secondary | ICD-10-CM

## 2011-11-19 NOTE — Telephone Encounter (Signed)
Pt was scheduled for office visit 11/19/2011 at 1400, however only daughter [name] arrived despite conversation with Dr. Marchelle Gearing . Pt informed Dr. Marchelle Gearing that she wanted daughter [name] to be notified of results . 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   Case and path reviewed with Dr. Marchelle Gearing .  Advised to refer to oncology.  Daughter requested referral to [facility] - Dr. Hyacinth Meeker Office notes, scan and path notes to be faxed.

## 2011-11-22 ENCOUNTER — Telehealth: Payer: Self-pay | Admitting: Internal Medicine

## 2011-11-22 ENCOUNTER — Other Ambulatory Visit: Payer: Self-pay | Admitting: Internal Medicine

## 2011-11-22 DIAGNOSIS — R918 Other nonspecific abnormal finding of lung field: Secondary | ICD-10-CM

## 2011-11-22 MED ORDER — ALPRAZOLAM 0.25 MG PO TABS: ORAL_TABLET | ORAL | Status: AC

## 2011-11-22 NOTE — Progress Notes (Signed)
Order for MRI brain w/ and w/o contrast per request of [facility] (per [facility]), to be done before 1.11.13 appt.

## 2011-11-22 NOTE — Progress Notes (Signed)
Addended by: Boone Master E on: 11/22/2011 02:17 PM   Modules accepted: Orders

## 2011-11-22 NOTE — Telephone Encounter (Signed)
Looks like Karen Coffey already has scheduled the pt with Dr. Fenton Malling at [facility] for 11/26/11 at 10 am. LMTCB.

## 2011-11-22 NOTE — Progress Notes (Signed)
BMET ordered for kidney function with MRI.

## 2011-11-22 NOTE — Telephone Encounter (Signed)
Noted and discussed. I specifically told her to bring her mom in for pain issues and likely admission. I am surprised she came in without patient. IN addition, she had used foul language over a weekend on one of the doctors. Appreciate you seeing patient. Agree with plan

## 2011-11-22 NOTE — Telephone Encounter (Signed)
Per TP: okay for xanax 0.25mg  #1 no refills as last filled by MR.  rx has been telephoned to [facility] [address]  If triage would please call the daughter to inform her of this and route to MR when finished.  Thanks.

## 2011-11-22 NOTE — Telephone Encounter (Signed)
I spoke with pt and she is aware this was phoned in for her. Will forward to MR

## 2011-11-23 ENCOUNTER — Other Ambulatory Visit (INDEPENDENT_AMBULATORY_CARE_PROVIDER_SITE_OTHER): Payer: Medicare Other

## 2011-11-23 ENCOUNTER — Ambulatory Visit (HOSPITAL_COMMUNITY)
Admission: RE | Admit: 2011-11-23 | Discharge: 2011-11-23 | Disposition: A | Discharge status: Home / Self Care | Payer: Medicare Other | Source: Ambulatory Visit | Attending: Internal Medicine | Admitting: Internal Medicine

## 2011-11-23 DIAGNOSIS — R918 Other nonspecific abnormal finding of lung field: Secondary | ICD-10-CM

## 2011-11-23 DIAGNOSIS — C3490 Malignant neoplasm of unspecified part of unspecified bronchus or lung: Secondary | ICD-10-CM | POA: Insufficient documentation

## 2011-11-23 DIAGNOSIS — M549 Dorsalgia, unspecified: Secondary | ICD-10-CM | POA: Insufficient documentation

## 2011-11-23 DIAGNOSIS — R222 Localized swelling, mass and lump, trunk: Secondary | ICD-10-CM

## 2011-11-23 LAB — BASIC METABOLIC PANEL
CO2: 29 mEq/L (ref 19–32)
Calcium: 8.6 mg/dL (ref 8.4–10.5)
GFR: 84.51 mL/min (ref >60.00)
Glucose, Bld: 99 mg/dL (ref 70–99)
Potassium: 4.1 mEq/L (ref 3.5–5.1)
Sodium: 127 mEq/L — ABNORMAL LOW (ref 135–145)

## 2011-11-23 MED ORDER — GADOBENATE DIMEGLUMINE 529 MG/ML IV SOLN
10.0000 mL | Freq: Once | INTRAVENOUS | Status: AC | PRN
  Administered 2011-11-23: 10 mL via INTRAVENOUS

## 2011-11-23 NOTE — Telephone Encounter (Signed)
Spoke with [name] and she states that she has already spoken with [facility] and is aware of appt date/time and that pt's records have been sent.

## 2011-11-25 NOTE — Telephone Encounter (Signed)
ok 

## 2011-12-01 ENCOUNTER — Other Ambulatory Visit: Payer: Self-pay | Admitting: *Deleted

## 2011-12-01 MED ORDER — LEVALBUTEROL TARTRATE 45 MCG/ACT IN AERO: 1.0000 | INHALATION_SPRAY | RESPIRATORY_TRACT | Status: DC | PRN

## 2012-05-19 ENCOUNTER — Other Ambulatory Visit: Payer: Self-pay | Admitting: Internal Medicine

## 2013-01-25 ENCOUNTER — Other Ambulatory Visit: Payer: Self-pay | Admitting: Internal Medicine

## 2013-02-05 ENCOUNTER — Other Ambulatory Visit: Payer: Self-pay | Admitting: Internal Medicine

## 2013-03-26 ENCOUNTER — Telehealth: Payer: Self-pay | Admitting: Internal Medicine

## 2013-03-26 ENCOUNTER — Encounter: Payer: Self-pay | Admitting: Internal Medicine

## 2013-03-26 ENCOUNTER — Ambulatory Visit (INDEPENDENT_AMBULATORY_CARE_PROVIDER_SITE_OTHER): Payer: Medicare Other | Admitting: Internal Medicine

## 2013-03-26 VITALS — BP 110/70 | HR 81 | Temp 98.1°F | Ht 68.0 in | Wt 95.6 lb

## 2013-03-26 DIAGNOSIS — F17200 Nicotine dependence, unspecified, uncomplicated: Secondary | ICD-10-CM

## 2013-03-26 DIAGNOSIS — J449 Chronic obstructive pulmonary disease, unspecified: Secondary | ICD-10-CM

## 2013-03-26 DIAGNOSIS — R64 Cachexia: Secondary | ICD-10-CM

## 2013-03-26 MED ORDER — PREDNISONE 10 MG PO TABS: ORAL_TABLET | ORAL | Status: DC

## 2013-03-26 MED ORDER — TIOTROPIUM BROMIDE MONOHYDRATE 18 MCG IN CAPS: 18.0000 ug | ORAL_CAPSULE | Freq: Every day | RESPIRATORY_TRACT | Status: DC

## 2013-03-26 MED ORDER — BUDESONIDE-FORMOTEROL FUMARATE 80-4.5 MCG/ACT IN AERO: 2.0000 | INHALATION_SPRAY | Freq: Two times a day (BID) | RESPIRATORY_TRACT | Status: DC

## 2013-03-26 NOTE — Telephone Encounter (Signed)
I spoke with pt and she stated she has spoken with someone. Nothing further was needed

## 2013-03-26 NOTE — Progress Notes (Signed)
Subjective:     Patient ID: Karen Coffey, female   DOB: 1946/09/01, 67 y.o.   MRN: [id number] PCP No primary provider on file.  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  #Smoker  reports that she has been smoking Cigarettes.  She has a 50 pack-year smoking history. She has never used smokeless tobacco.    OV 03/26/2013 Followup for the above.  After cancer diagnosis (Small cell Jan 2013) in JAn 2013 she and daughter transferred care to [facility] due to ? dissatisfaction witih [place] at [facility]. Since  going to [facility] , she is s/p prophylactic XRT and chemo. Apparently her cancer is under complete remission.   Of note, She does not have PCP and no primary pulmonary specialist. She is now back at [facility] per her hx at request of DR Marcelline Mates who has reportedly told her hthat she needs a pulmonologist. She is c/o progressive dyspnea since past 1 year esp last 2 months. DOE for waling 20-30 feet, and talking and changing clothes. Improved by rest and inhalers and oxygen. SHe uses oxygen at night but she also needs recertification. Past year sleeping in recliner due to orthopnea. Only Rx is xopenex. She is also having cough with white sputum that is moderate. COPD cat score is 31 and reflects high symptom burden.  She continues to smoke but reduced to 3 cig per day which she "does not ihaled"       CAT COPD Symptom & Quality of Life Score (GSK trademark) 0 is no burden. 5 is highest burden 03/26/2013   Never Cough -> Cough all the time 4  No phlegm in chest -> Chest is full of phlegm 5  No chest tightness -> Chest feels very tight 0  No dyspnea for 1 flight stairs/hill -> Very dyspneic for 1 flight of stairs 5  No limitations for ADL at home -> Very limited with ADL at  home 5  Confident leaving home -> Not at all confident leaving home 3  Sleep soundly -> Do not sleep soundly because of lung condition 5  Lots of Energy -> No energy at all 3  TOTAL Score (max 40)  31      Review of Systems  Constitutional: Negative for fever and unexpected weight change.  HENT: Negative for ear pain, nosebleeds, congestion, sore throat, rhinorrhea, sneezing, trouble swallowing, dental problem, postnasal drip and sinus pressure.   Eyes: Negative for redness and itching.  Respiratory: Positive for cough, shortness of breath and wheezing. Negative for chest tightness.   Cardiovascular: Negative for palpitations and leg swelling.  Gastrointestinal: Negative for nausea and vomiting.  Genitourinary: Negative for dysuria.  Musculoskeletal: Negative for joint swelling.  Skin: Negative for rash.  Neurological: Positive for dizziness and light-headedness. Negative for headaches.  Hematological: Does not bruise/bleed easily.  Psychiatric/Behavioral: Negative for dysphoric mood. The patient is not nervous/anxious.    .Past, Family, Social reviewed: Daughter [name] is main contact.      Objective:   Physical Exam  Vitals reviewed. Constitutional: She is oriented to person, place, and time. She appears well-developed and well-nourished. No distress.  HENT:  Head: Normocephalic and atraumatic.  Right Ear: External ear normal.  Left Ear: External ear normal.  Mouth/Throat: Oropharynx is clear and moist. No oropharyngeal exudate.  Eyes: Conjunctivae and EOM are normal. Pupils are equal, round, and reactive to light. Right eye exhibits no discharge. Left eye exhibits no discharge. No scleral icterus.  Neck: Normal range of motion. Neck supple. No JVD present. No tracheal deviation present. No thyromegaly present.  Cardiovascular: Normal rate, regular rhythm, normal heart sounds and intact distal pulses.  Exam reveals no gallop and no friction rub.   No murmur  heard. Pulmonary/Chest: Effort normal and breath sounds normal. No respiratory distress. She has no wheezes. She has no rales. She exhibits no tenderness.  Abdominal: Soft. Bowel sounds are normal. She exhibits no distension and no mass. There is no tenderness. There is no rebound and no guarding.  Musculoskeletal: Normal range of motion. She exhibits no edema and no tenderness.  Lymphadenopathy:    She has no cervical adenopathy.  Neurological: She is alert and oriented to person, place, and time. She has normal reflexes. No cranial nerve deficit. She exhibits normal muscle tone. Coordination normal.  Skin: Skin is warm and dry. No rash noted. She is not diaphoretic. No erythema. No pallor.  Psychiatric: She has a normal mood and affect. Her behavior is normal. Judgment and thought content normal.       Assessment:      Plan:

## 2013-03-26 NOTE — Assessment & Plan Note (Signed)
Body mass index is 14.54 kg/(m^2).  Her body mass index of 14.5 and the presence of COPD and small cell lung cancer portends a poor prognosis. I will address this at followup. She will need to a high-protein diet

## 2013-03-26 NOTE — Assessment & Plan Note (Signed)
She was documented to Gold stage III COPD in January 2013 but when she stopped following up with [place] we never were able to start treatment for this. She currently has worsening COPD symptoms and is unclear to me if this is because of progressive COPD a COPD exacerbation. Nevertheless, I will treat her with a prednisone burst start her on Spiriva and Symbicort. I will test overnight oxygen saturation on room and I will see her with full pulmonary function test in a few weeks

## 2013-03-26 NOTE — Progress Notes (Signed)
Subjective:    Patient ID: Karen Coffey, female    DOB: Mar 30, 1946, 67 y.o.   MRN: [id number]  HPI 67 year old female.  SMoker.  Aware of copd diagnosis since 2008 but not on Rx or fu for same.  No regular PMD. REferred by [facility] urgent care. Baseline chronic cough  Started having left infrascapular chest pains x 1 month. Improved by sitting up straight. Normally aching and mild when sitting still but radomly can get worse and become throbbing. Made worse when she lies down on the side. No relationship with inspiration or breathing. Severity is 2-3 of 10. Associated sensation of something moving around since starting levoflox and prednisone 10 days ago. Has completed course.   Chronic cough for several years is unchanged. RSI score is 17. [Has level for annoying cough. Has level III hoarseness of voice clearing of throat, coughing and lying down. Has level I excess mucus drip, difficulty swallowing liquids or tablets, choking episodes, and sensation of something in throat, and level 0 of  Heartburn]. Assoicated mild white spiutum +. Sputum gets more when she uses inhaler that was started recently. Associated weight loss + ? 20#. Body mass index is 16.70 kg/(m^2).   Above symptoms went to urgent care and resulted in CT chest 10/20/11 : that showed Low left internal jugular lymph node measures 9 mm. Left perihilar mass measures 8.2 x 6.2 cm and is contiguous with bulky adenopathy in the AP window and prevascular space. 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 Heart size  normal. No pericardial effusion. Emphysema. There is obstruction of the left upper lobe bronchus,  with postobstructive pneumonitis and consolidation. Pleural nodularity is seen in the posterior left hemithorax. Scarring in the posterior right upper lobe. No pleural fluid.  She is aware this is tumor and says she does not want to know what it is but  says "I do not want to think that or the worst". When I prompted her, she said "it is the C word". She says she does not want to see the image. She prefers daughter Karen Coffey and sister Karen Coffey be given diagnosis after biopsy. She clearly does not want to know prognosis. Displaced extreme anxiety about testing even PET scan  SOCIAL Daughter Karen Coffey and sister Karen Coffey will be first lines of communication. She is close to her son Karen Coffey who was in [place]. This another daughter [name] who is not in communication with family for 2 years.  #Chest lump  - please have PET scan asap  - will call daughter or sister with rsults and discuss next step  #Cough and shortness of breath  - have breathing test full PFT at [facility] as[facility]  - have walking oxygen test today  #Rapid heart beat due to albuterol  - will review breathing test and then we can discuss med change at followup visit  #Smoking  - will discuss at followup  #Followup  - will call with results to discuss     Review of Systems  Constitutional: Negative for fever and unexpected weight change.  HENT: Negative for ear pain, nosebleeds, congestion, sore throat, rhinorrhea, sneezing, trouble swallowing, dental problem, postnasal drip and sinus pressure.   Eyes: Negative for redness and itching.  Respiratory: Negative for cough, chest tightness, shortness of breath and wheezing.   Cardiovascular: Negative for palpitations and leg swelling.  Gastrointestinal: Negative for nausea and vomiting.  Genitourinary: Negative for dysuria.  Musculoskeletal: Negative for joint swelling.  Skin: Negative for rash.  Neurological: Negative for headaches.  Hematological: Does not bruise/bleed easily.  Psychiatric/Behavioral: Negative for dysphoric mood. The patient is not nervous/anxious.        Objective:   Physical Exam Vitals reviewed. Constitutional: She is oriented to person, place, and time. She appears well-nourished. No distress.        Body mass index is 16.70 kg/(m^2).   HENT:  Head: Normocephalic and atraumatic.  Right Ear: External ear normal.  Left Ear: External ear normal.  Mouth/Throat: Oropharynx is clear and moist. No oropharyngeal exudate.  Eyes: Conjunctivae and EOM are normal. Pupils are equal, round, and reactive to light. Right eye exhibits no discharge. Left eye exhibits no discharge. No scleral icterus.  Neck: Normal range of motion. Neck supple. No JVD present. No tracheal deviation present. No thyromegaly present.  Cardiovascular: Normal rate, regular rhythm, normal heart sounds and intact distal pulses.  Exam reveals no gallop and no friction rub.   No murmur heard.      Got tachycardic to 160 after visit while gtting pet scan organized and sent to ER  Pulmonary/Chest: Effort normal and breath sounds normal. No respiratory distress. She has no wheezes. She has no rales. She exhibits no tenderness.  Abdominal: Soft. Bowel sounds are normal. She exhibits no distension and no mass. There is no tenderness. There is no rebound and no guarding.  Musculoskeletal: Normal range of motion. She exhibits no edema and no tenderness.  Lymphadenopathy:    She has no cervical adenopathy.  Neurological: She is alert and oriented to person, place, and time. She has normal reflexes. No cranial nerve deficit. She exhibits normal muscle tone. Coordination normal.  Skin: Skin is warm and dry. No rash noted. She is not diaphoretic. No erythema. No pallor.  Psychiatric: Judgment and thought content normal.       Highly anxious         Assessment & Plan:

## 2013-03-26 NOTE — Assessment & Plan Note (Signed)
She small cell lung cancer and advanced COPD which is that he quit smoking. I wonder that she really needs to quit smoking. We will work on this as she follows up  3 minutes face-to-face counseling on this topic

## 2013-03-26 NOTE — Patient Instructions (Addendum)
#  COPD  - you have bad copd - Please take Take prednisone 40mg  once daily x 3 days, then 30mg  once daily x 3 days, then 20mg  once daily x 3 days, then prednisone 10mg  once daily  x 3 days and stop - Please start spiriva 1 puff daily - take , script and show technique - Please take symbicor 80/4.5, 2 puff twice daily - take script, show technique - TEst for oxygen overnight on room air  #Followup  - full PFT in 3 weeks -   4 weeks - return to see me ater full PFT

## 2013-04-05 ENCOUNTER — Telehealth: Payer: Self-pay | Admitting: Internal Medicine

## 2013-04-05 NOTE — Telephone Encounter (Signed)
Pt states today was the start of her once a day prednisone taper but she has 4 tablets left. I advised the pt to just finish out the prednisone taper as directed, taking 1 tablet daily x 3 days and then stop. Advised it was ok if she has a tablet left over, that she must have missed a day somewhere. Carron Curie, CMA

## 2013-04-10 IMAGING — CT CT CHEST W/ CM
2 of 3 series · 15 of 36 positions shown, 18 images · IV contrast (75CC OMNI 300)
Comparison: 12/25/2006.

CLINICAL DATA: Cough, abnormal chest radiograph.  Shortness of
breath.

CT CHEST WITH CONTRAST
TECHNIQUE: Multidetector CT imaging of the chest was performed
following the standard protocol during bolus administration of
intravenous contrast.
Contrast: 75mL OMNIPAQUE IOHEXOL 300 MG/ML IV SOLN

[Series 2: chest with · axial · 0.63mm/px · z∈[-276,-1]mm · 12 of 65 slices shown, 15 images]
[im 5/65  mediastinal]
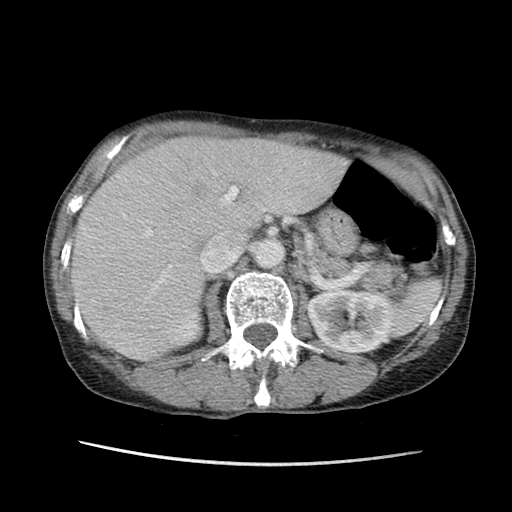
[im 5/65  lung]
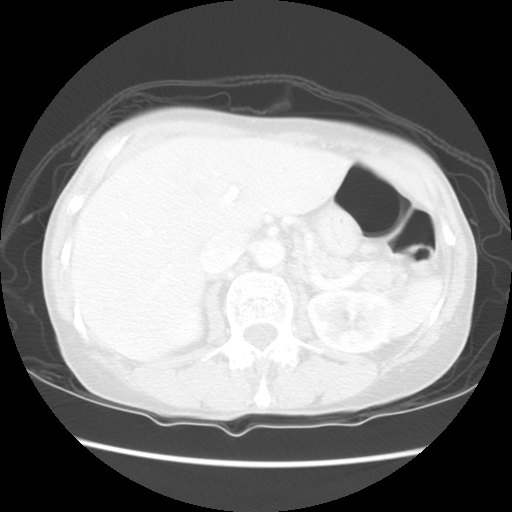
[im 10/65  lung]
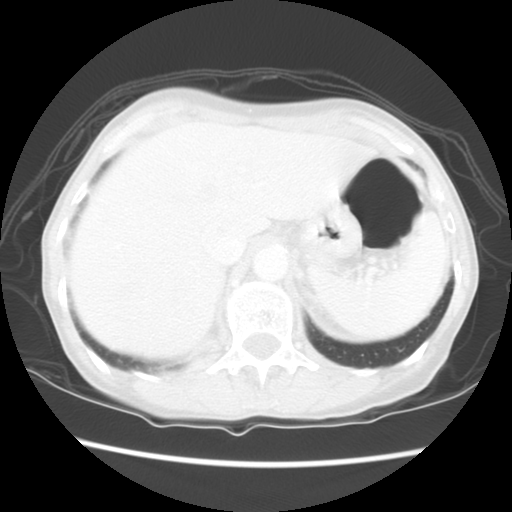
[im 15/65  lung]
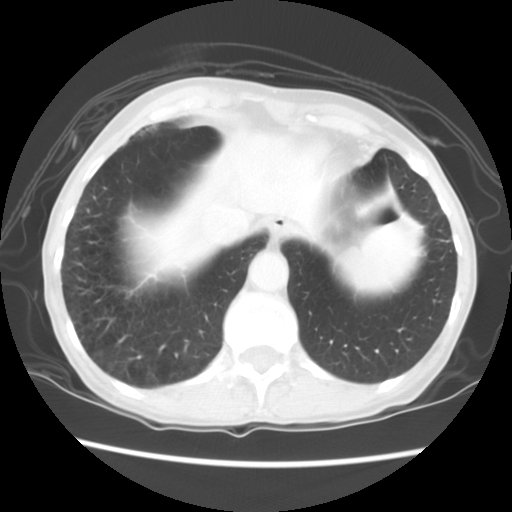
[im 19/65  lung]
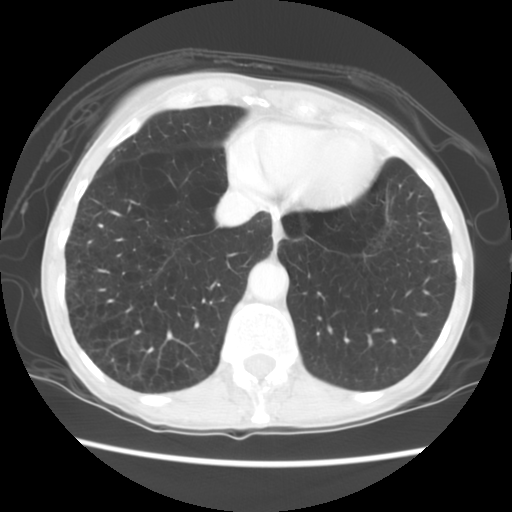
[im 24/65  mediastinal]
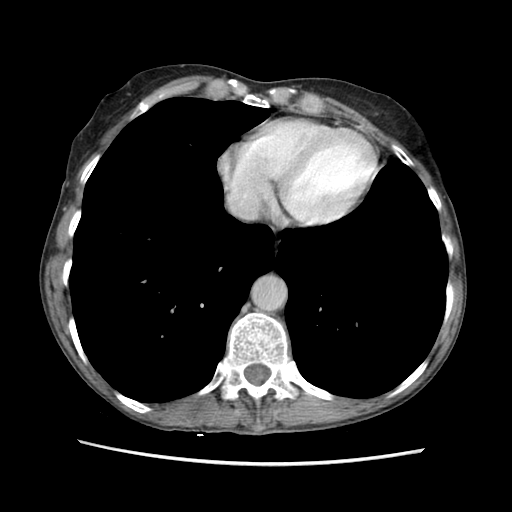
[im 24/65  lung]
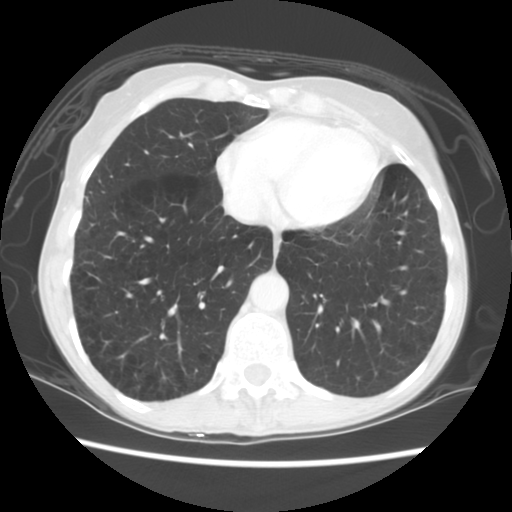
[im 29/65  lung]
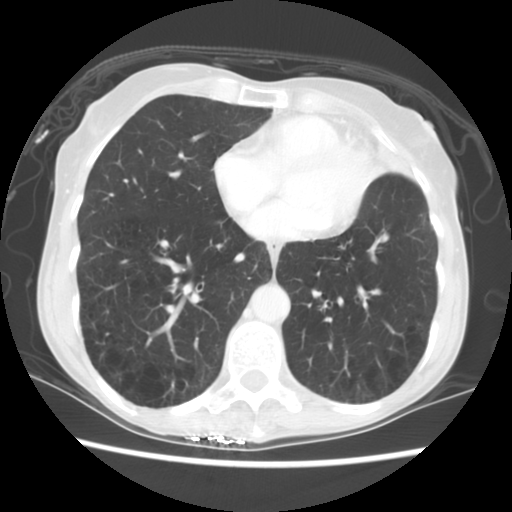
[im 36/65  lung]
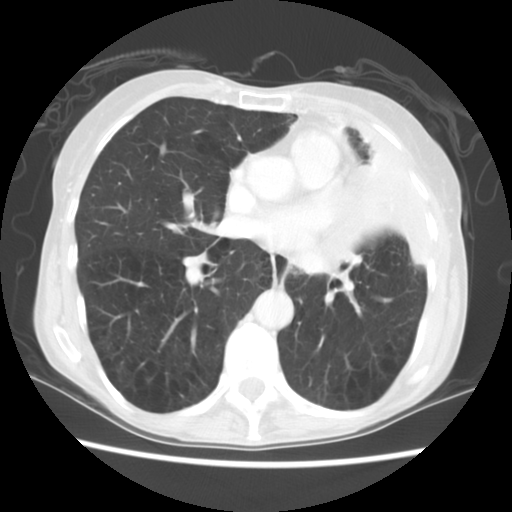
[im 41/65  lung]
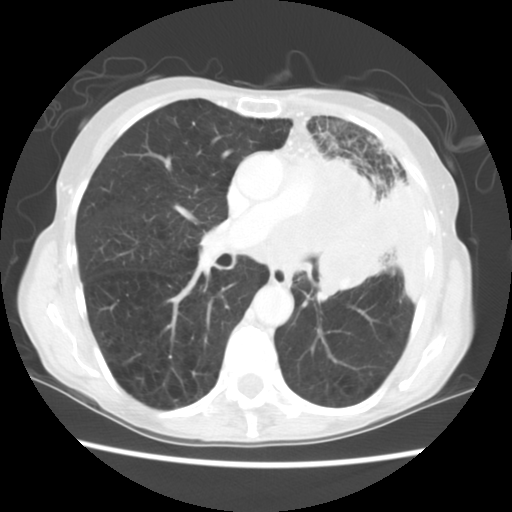
[im 46/65  mediastinal]
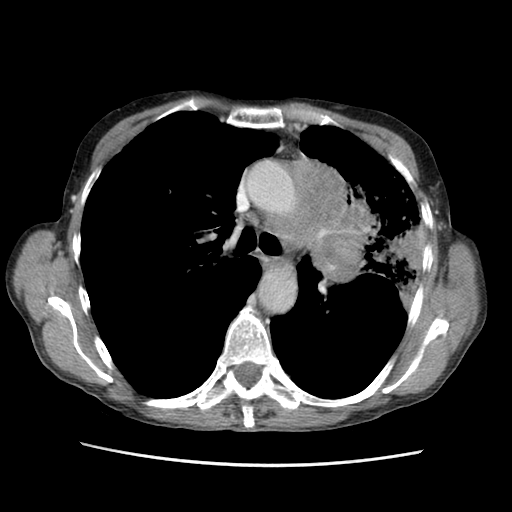
[im 46/65  lung]
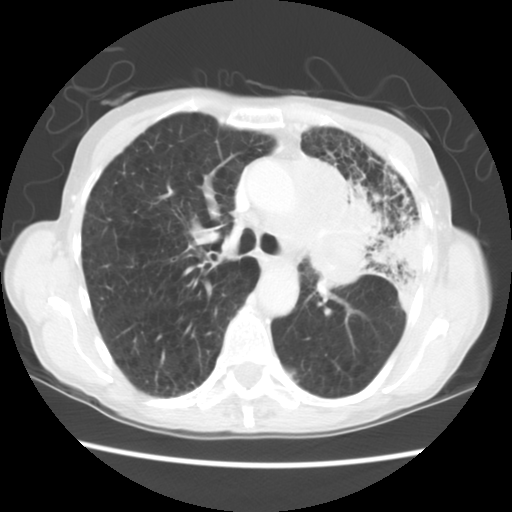
[im 50/65  lung]
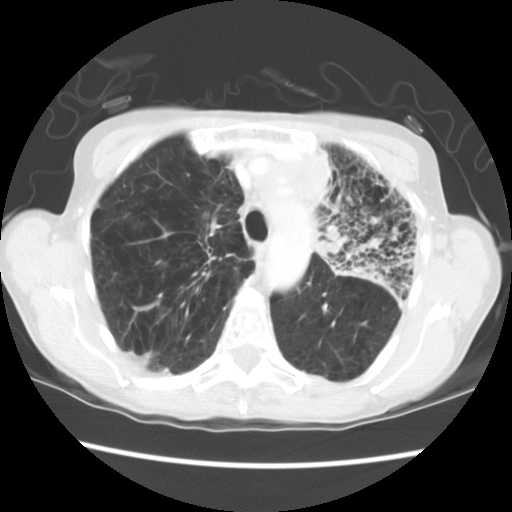
[im 55/65  lung]
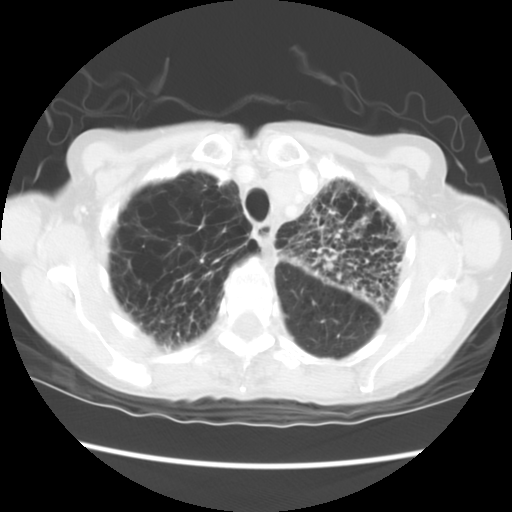
[im 60/65  lung]
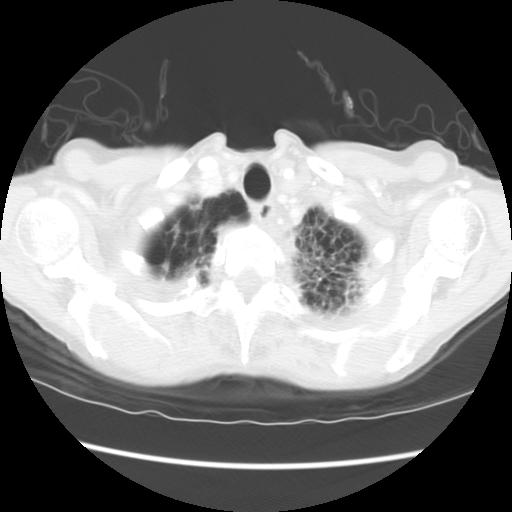

[Series 400: cor · coronal · 0.64mm/px · 3 of 102 slices shown]
[im 21/102  lung]
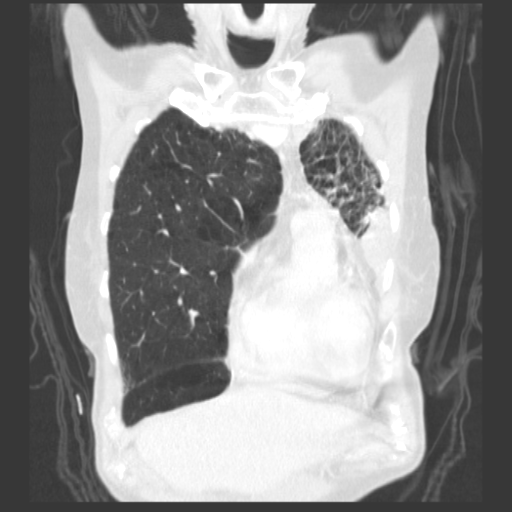
[im 41/102  lung]
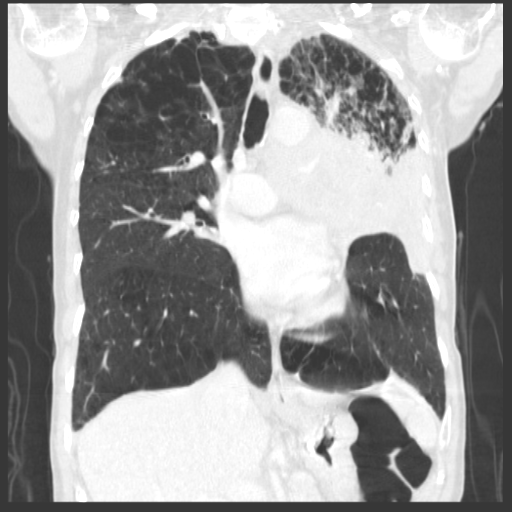
[im 61/102  lung]
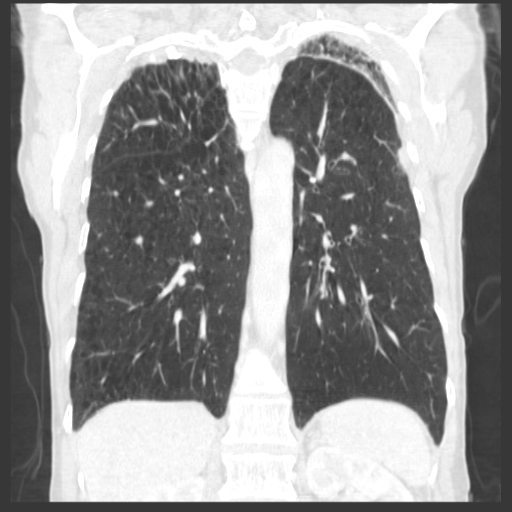

[15 of 36 positions shown; findings below may reference images not displayed]

BUN and creatinine were obtained on site at [HOSPITAL] at
[HOSPITAL].
Results:  BUN 5.0 mg/dL,  Creatinine 0.8 mg/dL.
FINDINGS: Low left internal jugular lymph node measures 9 mm. Left
perihilar mass measures 8.2 x 6.2 cm and is contiguous with bulky
adenopathy in the AP window and prevascular space.  There is near
complete obliteration of the left main pulmonary artery, with
obstruction of the left superior pulmonary vein.  Heart size
normal.  No pericardial effusion.

Emphysema. There is obstruction of the left upper lobe bronchus,
with postobstructive pneumonitis and consolidation.  Pleural
nodularity is seen in the posterior left hemithorax.  Scarring in
the posterior right upper lobe.  No pleural fluid.

Incidental imaging of the upper abdomen shows no acute findings.
No worrisome lytic or sclerotic lesions.
IMPRESSION: 1.  Findings are most consistent with primary bronchogenic
carcinoma, as evidenced by a large left upper lobe mass,
mediastinal and low left internal jugular adenopathy, and left
pleural nodularity.
2.  Obstruction of the left upper lobe bronchus with
postobstructive pneumonitis and collapse/consolidation.

## 2013-04-11 ENCOUNTER — Encounter: Payer: Self-pay | Admitting: Internal Medicine

## 2013-04-16 ENCOUNTER — Telehealth: Payer: Self-pay | Admitting: Internal Medicine

## 2013-04-16 NOTE — Telephone Encounter (Signed)
I called number given and NA, no voicemail. According to last OV note MR wanted an ONO, this may be what the pt is calling about. I do not see the results in the system so I called [facility] to see if they have ONO. I spoke with Synetta Fail and was advised that they never received this order. I called Bjorn Loser to verify that order was sent to [facility] and per her records it was faxed to [facility] on 03-26-13. Bjorn Loser will refax order.   Also the pt was supposed to have a PFT per last OV note and this was not scheduled either. WCB to discuss all the above with the pt.Rolfe Hartsell Yancey Flemings, CMA

## 2013-04-17 NOTE — Telephone Encounter (Signed)
Called, spoke with pt.  Informed her of below.  She verbalized understanding.  Per last OV with MR On 03/26/13:  Patient Instructions    #COPD  - you have bad copd  - Please take Take prednisone 40mg  once daily x 3 days, then 30mg  once daily x 3 days, then 20mg  once daily x 3 days, then prednisone 10mg  once daily x 3 days and stop  - Please start spiriva 1 puff daily - take , script and show technique  - Please take symbicor 80/4.5, 2 puff twice daily - take script, show technique  - TEst for oxygen overnight on room air  #Followup  - full PFT in 3 weeks - 4 weeks  - return to see me ater full PFT   -----  Pt doesn't have a PFT or OV scheduled.  MR doesn't have any openings until July.  OK per Britt Boozer for pt to see TP for this f/u per MR's request.  We have scheduled pt to have PFT on Friday, June 6 at 1 pm [facility] (no openings in our office) and to see TP at 2:30 pm on the same day in the office.  Pt aware of pending OVs and locations.  She is aware she will be receiving a call to have ONO scheduled by [facility].  I called [facility] to ensure order was received.  I spoke with Melissa.  Was advised they did receive the order and are currently working on scheduling this.  Efraim Kaufmann is aware of pt's pending OV on Friday and will fax results to triage once available.   Will route msg to Jess to make sure all results are received for OV with TP on Friday.

## 2013-04-20 ENCOUNTER — Ambulatory Visit (HOSPITAL_COMMUNITY)
Admission: RE | Admit: 2013-04-20 | Discharge: 2013-04-20 | Disposition: A | Discharge status: Home / Self Care | Payer: Medicare Other | Source: Ambulatory Visit | Attending: Internal Medicine | Admitting: Internal Medicine

## 2013-04-20 ENCOUNTER — Ambulatory Visit (INDEPENDENT_AMBULATORY_CARE_PROVIDER_SITE_OTHER): Payer: Medicare Other | Admitting: Adult Health

## 2013-04-20 ENCOUNTER — Encounter: Payer: Self-pay | Admitting: Adult Health

## 2013-04-20 VITALS — BP 110/62 | HR 74 | Temp 96.8°F | Ht 68.0 in | Wt 104.0 lb

## 2013-04-20 DIAGNOSIS — J438 Other emphysema: Secondary | ICD-10-CM | POA: Insufficient documentation

## 2013-04-20 DIAGNOSIS — F17200 Nicotine dependence, unspecified, uncomplicated: Secondary | ICD-10-CM | POA: Insufficient documentation

## 2013-04-20 DIAGNOSIS — J449 Chronic obstructive pulmonary disease, unspecified: Secondary | ICD-10-CM

## 2013-04-20 LAB — PULMONARY FUNCTION TEST

## 2013-04-20 MED ORDER — LEVALBUTEROL HCL 0.63 MG/3ML IN NEBU
0.6300 mg | INHALATION_SOLUTION | Freq: Once | RESPIRATORY_TRACT | Status: AC
  Administered 2013-04-20: 0.63 mg via RESPIRATORY_TRACT
  Filled 2013-04-20: qty 3

## 2013-04-20 NOTE — Progress Notes (Signed)
Subjective:     Patient ID: Karen Coffey, female   DOB: 09/17/1946, 67 y.o.   MRN: [id number] PCP No primary provider on file.  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  #Smoker  reports that she has been smoking Cigarettes.  She has a 50 pack-year smoking history. She has never used smokeless tobacco.    OV 03/26/2013 Followup for the above. After cancer diagnosis (Small cell Jan 2013) in JAn 2013 she and daughter transferred care to [facility] due to ? dissatisfaction witih [place] at [facility]. Since  going to [facility] , she is s/p prophylactic XRT and chemo. Apparently her cancer is under complete remission.   Of note, She does not have PCP and no primary pulmonary specialist. She is now back at [facility] per her hx at request of DR Marcelline Mates who has reportedly told her hthat she needs a pulmonologist. She is c/o progressive dyspnea since past 1 year esp last 2 months. DOE for waling 20-30 feet, and talking and changing clothes. Improved by rest and inhalers and oxygen. SHe uses oxygen at night but she also needs recertification. Past year sleeping in recliner due to orthopnea. Only Rx is xopenex. She is also having cough with white sputum that is moderate. COPD cat score is 31 and reflects high symptom burden.  She continues to smoke but reduced to 3 cig per day which she "does not ihaled"       CAT COPD Symptom & Quality of Life Score (GSK trademark) 0 is no burden. 5 is highest burden 03/26/2013   Never Cough -> Cough all the time 4  No phlegm in chest -> Chest is full of phlegm 5  No chest tightness -> Chest feels very tight 0  No dyspnea for 1 flight stairs/hill -> Very dyspneic for 1 flight of stairs 5  No limitations for ADL at home -> Very limited with ADL at home  5  Confident leaving home -> Not at all confident leaving home 3  Sleep soundly -> Do not sleep soundly because of lung condition 5  Lots of Energy -> No energy at all 3  TOTAL Score (max 40)  31   04/20/2013 Follow up w/ PFT  Returns for follow up . Last ov with COPD flare , tx w/ steroids . Feeling much better.  Continues to smoke, we discussed smoking cessation  HadPFT on 04/20/13 showed FEV1 at 1.21 L (44%) , ratio 43 ,  + change with BD (31%)  We reviewed these results in detail .  She says symbicort and spiriva are helping her breathing a lot.  No fever , chest pain or orthopnea,  Gets swollen ankles in evening, using more salt than usual.  We discussed low salt diet and leg elevation  No calf pain .    Review of Systems  Constitutional: Negative for fever and unexpected weight change.  HENT: Negative for ear pain, nosebleeds, congestion, sore throat, rhinorrhea, sneezing, trouble swallowing, dental problem, postnasal drip and sinus pressure.   Eyes: Negative for redness and itching.  Respiratory:  Negative for chest tightness.   Cardiovascular: Negative for palpitations and leg swelling.  Gastrointestinal: Negative for nausea and vomiting.  Genitourinary: Negative for dysuria.  Musculoskeletal: Negative for  joint swelling.  Skin: Negative for rash.  Neurological:  Negative for headaches.  Hematological: Does not bruise/bleed easily.  Psychiatric/Behavioral: Negative for dysphoric mood. The patient is not nervous/anxious.        Objective:   Physical Exam  Vitals reviewed. Constitutional: She is oriented to person, place, and time. She appears well-developed and well-nourished. No distress.  HENT:  Head: Normocephalic and atraumatic.  Right Ear: External ear normal.  Left Ear: External ear normal.  Mouth/Throat: Oropharynx is clear and moist. No oropharyngeal exudate.  Eyes: Conjunctivae and EOM are normal. Pupils are equal, round, and reactive to light. Right eye  exhibits no discharge. Left eye exhibits no discharge. No scleral icterus.  Neck: Normal range of motion. Neck supple. No JVD present. No tracheal deviation present. No thyromegaly present.  Cardiovascular: Normal rate, regular rhythm, normal heart sounds and intact distal pulses.  Exam reveals no gallop and no friction rub.   No murmur heard. Pulmonary/Chest: Effort normal and breath sounds normal. No respiratory distress. She has no wheezes. She has no rales. She exhibits no tenderness.  Abdominal: Soft. Bowel sounds are normal. She exhibits no distension and no mass. There is no tenderness. There is no rebound and no guarding.  Musculoskeletal: Normal range of motion. She exhibits no edema and no tenderness.  Lymphadenopathy:    She has no cervical adenopathy.  Neurological: She is alert and oriented to person, place, and time. She has normal reflexes. No cranial nerve deficit. She exhibits normal muscle tone. Coordination normal.  Skin: Skin is warm and dry. No rash noted. She is not diaphoretic. No erythema. No pallor.  Psychiatric: She has a normal mood and affect. Her behavior is normal. Judgment and thought content normal.       Assessment:      Plan:

## 2013-04-20 NOTE — Assessment & Plan Note (Addendum)
Improved control on current regimen.   Plan   MOST IMPORTANT GOAL IS TO QUIT SMOKING  Continue on symbicort and spiriva  We are referring you to pulmonary rehab.  follow up Dr. Marchelle Gearing in 6-8 weeks and As needed

## 2013-04-20 NOTE — Patient Instructions (Addendum)
MOST IMPORTANT GOAL IS TO QUIT SMOKING  Continue on symbicort and spiriva  We are referring you to pulmonary rehab.  follow up Dr. Marchelle Gearing in 6-8 weeks and As needed

## 2013-05-01 ENCOUNTER — Telehealth: Payer: Self-pay | Admitting: Internal Medicine

## 2013-05-01 NOTE — Telephone Encounter (Signed)
Overnight oxygen study done on 04/19/2013 does not show any desaturations

## 2013-05-08 ENCOUNTER — Encounter: Payer: Self-pay | Admitting: Internal Medicine

## 2013-06-08 ENCOUNTER — Ambulatory Visit: Payer: Medicare Other | Admitting: Internal Medicine

## 2013-06-18 ENCOUNTER — Telehealth: Payer: Self-pay | Admitting: Internal Medicine

## 2013-06-18 NOTE — Telephone Encounter (Signed)
RX was sent back in may x 12 refills. [facility] called [facility] and confirmed they did have [facility]ills on file for pt.   i called and made pt aware. Nothing further was needed

## 2013-06-20 ENCOUNTER — Telehealth (HOSPITAL_COMMUNITY): Payer: Self-pay | Admitting: *Deleted

## 2013-06-20 NOTE — Telephone Encounter (Signed)
Patient called regarding referral for Pulmonary Rehab.  She cannot afford to buy gas to participate.    Cathie Olden RN

## 2013-07-23 ENCOUNTER — Ambulatory Visit (INDEPENDENT_AMBULATORY_CARE_PROVIDER_SITE_OTHER): Payer: Medicare Other | Admitting: Internal Medicine

## 2013-07-23 ENCOUNTER — Encounter: Payer: Self-pay | Admitting: Internal Medicine

## 2013-07-23 VITALS — BP 110/70 | HR 72 | Temp 98.1°F | Ht 68.0 in | Wt 111.8 lb

## 2013-07-23 DIAGNOSIS — J449 Chronic obstructive pulmonary disease, unspecified: Secondary | ICD-10-CM

## 2013-07-23 NOTE — Progress Notes (Signed)
Subjective:    Patient ID: Karen Coffey, female    DOB: 1946-01-11, 67 y.o.   MRN: [id number]  HPI PCP No primary provider on file.  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 - PFT on 04/20/13 showed FEV1 at 1.21 L (44%) , ratio 43 , + change with BD (31%)    #Smoker  reports that she has been smoking Cigarettes.  She has a 50 pack-year smoking history. She has never used smokeless tobacco.    OV 03/26/2013 Followup for the above. After cancer diagnosis (Small cell Jan 2013) in JAn 2013 she and daughter transferred care to [facility] due to ? dissatisfaction witih [place] at [facility]. Since  going to [facility] , she is s/p prophylactic XRT and chemo. Apparently her cancer is under complete remission.   Of note, She does not have PCP and no primary pulmonary specialist. She is now back at [facility] per her hx at request of DR Marcelline Mates who has reportedly told her hthat she needs a pulmonologist. She is c/o progressive dyspnea since past 1 year esp last 2 months. DOE for waling 20-30 feet, and talking and changing clothes. Improved by rest and inhalers and oxygen. SHe uses oxygen at night but she also needs recertification. Past year sleeping in recliner due to orthopnea. Only Rx is xopenex. She is also having cough with white sputum that is moderate. COPD cat score is 31 and reflects high symptom burden.  She continues to smoke but reduced to 3 cig per day which she "does not ihaled"    04/20/2013 Follow up w/ PFT  Returns for follow up . Last ov with COPD flare , tx w/ steroids . Feeling much better.  Continues to smoke, we discussed smoking cessation  HadPFT on 04/20/13 showed FEV1 at 1.21 L (44%) , ratio 43 ,  + change with BD (31%)  We reviewed these results in  detail .  She says symbicort and spiriva are helping her breathing a lot.  No fever , chest pain or orthopnea,  Gets swollen ankles in evening, using more salt than usual.  We discussed low salt diet and leg elevation  No calf pain .    MOST IMPORTANT GOAL IS TO QUIT SMOKING  Continue on symbicort and spiriva  We are referring you to pulmonary rehab.  follow up Dr. Marchelle Gearing in 6-8 weeks and As needed    OV 07/23/2013  Followup COPD smoking  - She is much improved after starting Spiriva and Symbicort. CAT score is dropped to 21 and 31. She still has dyspnea for doing yard work. She is interested in pulmonary rehabilitation. She is refusing to start in our office with his [facility]  - In terms of smoking she continues to smoke and is not very keen on quitting  - In terms of small cell lung cancer she is on surveillance at [facility] and has an appointment coming up this month   CAT COPD Symptom & Quality of Life Score (GSK trademark) 0 is no burden. 5 is highest burden 03/26/2013 Pre RX 07/23/2013 spiriva and symbicort  Never Cough -> Cough all the time 4 4  No phlegm in chest ->  Chest is full of phlegm 5 2  No chest tightness -> Chest feels very tight 0 1  No dyspnea for 1 flight stairs/hill -> Very dyspneic for 1 flight of stairs 5 4  No limitations for ADL at home -> Very limited with ADL at home 5 4  Confident leaving home -> Not at all confident leaving home 3 1  Sleep soundly -> Do not sleep soundly because of lung condition 5 2  Lots of Energy -> No energy at all 3 3  TOTAL Score (max 40)  31 21      Past, Family, Social reviewed: no change since last visit    Review of Systems  Constitutional: Negative for fever and unexpected weight change.  HENT: Negative for ear pain, nosebleeds, congestion, sore throat, rhinorrhea, sneezing, trouble swallowing, dental problem, postnasal drip and sinus pressure.   Eyes: Negative for redness and itching.   Respiratory: Positive for shortness of breath. Negative for cough, chest tightness and wheezing.   Cardiovascular: Negative for palpitations and leg swelling.  Gastrointestinal: Negative for nausea and vomiting.  Genitourinary: Negative for dysuria.  Musculoskeletal: Negative for joint swelling.  Skin: Negative for rash.  Neurological: Negative for headaches.  Hematological: Does not bruise/bleed easily.  Psychiatric/Behavioral: Negative for dysphoric mood. The patient is not nervous/anxious.   Current outpatient prescriptions:budesonide-formoterol (SYMBICORT) 80-4.5 MCG/ACT inhaler, Inhale 2 puffs into the lungs 2 (two) times daily., Disp: 1 Inhaler, Rfl: 12;  oxycodone (OXY-IR) 5 MG capsule, Take 5 mg by mouth every 4 (four) hours as needed., Disp: , Rfl: ;  tiotropium (SPIRIVA) 18 MCG inhalation capsule, Place 1 capsule (18 mcg total) into inhaler and inhale daily., Disp: 30 capsule, Rfl: 6 XOPENEX HFA 45 MCG/ACT inhaler, INHALE 1-2 PUFFS INTO THE LUNGS EVERY 4 HRS AS NEEDED FOR WHEEZING, Disp: 15 g, Rfl: 6     Objective:   Physical Exam Vitals reviewed. Constitutional: She is oriented to person, place, and time. She appears well-developed and well-nourished. No distress.  HENT:  Head: Normocephalic and atraumatic.  Right Ear: External ear normal.  Left Ear: External ear normal.  Mouth/Throat: Oropharynx is clear and moist. No oropharyngeal exudate.  Eyes: Conjunctivae and EOM are normal. Pupils are equal, round, and reactive to light. Right eye exhibits no discharge. Left eye exhibits no discharge. No scleral icterus.  Neck: Normal range of motion. Neck supple. No JVD present. No tracheal deviation present. No thyromegaly present.  Cardiovascular: Normal rate, regular rhythm, normal heart sounds and intact distal pulses.  Exam reveals no gallop and no friction rub.   No murmur heard. Pulmonary/Chest: Effort normal and breath sounds normal. No respiratory distress. She has no wheezes.  She has no rales. She exhibits no tenderness.  Abdominal: Soft. Bowel sounds are normal. She exhibits no distension and no mass. There is no tenderness. There is no rebound and no guarding.  Musculoskeletal: Normal range of motion. She exhibits no edema and no tenderness.  Lymphadenopathy:    She has no cervical adenopathy.  Neurological: She is alert and oriented to person, place, and time. She has normal reflexes. No cranial nerve deficit. She exhibits normal muscle tone. Coordination normal.  Skin: Skin is warm and dry. No rash noted. She is not diaphoretic. No erythema. No pallor.  Psychiatric: She has a normal mood and affect. Her behavior is normal. Judgment and thought content normal.             Assessment & Plan:

## 2013-07-23 NOTE — Patient Instructions (Addendum)
#  COPD  -copd stable - Please continue spiriva 1 puff daily -  - Please continue  symbicor 80/4.5, 2 puff twice daily -  - Respect your decision to refuse flu shot in ourr office; [facility] - ask for high dose flu shot if they have it - I am have referred you to pumonary rehab   #Followup  -6 months or sooner  

## 2013-07-24 NOTE — Assessment & Plan Note (Signed)
#  COPD  -copd stable - Please continue spiriva 1 puff daily -  - Please continue  symbicor 80/4.5, 2 puff twice daily -  - Respect your decision to refuse flu shot in ourr office; [facility] - ask for high dose flu shot if they have it - I am have referred you to pumonary rehab   #Followup  -6 months or sooner

## 2014-04-01 ENCOUNTER — Telehealth: Payer: Self-pay | Admitting: Internal Medicine

## 2014-04-01 MED ORDER — LEVALBUTEROL TARTRATE 45 MCG/ACT IN AERO: INHALATION_SPRAY | RESPIRATORY_TRACT | Status: DC

## 2014-04-01 NOTE — Telephone Encounter (Signed)
Called melony. Gave Vo FOR REFILL ON XOPENEX INHALER.

## 2014-05-13 ENCOUNTER — Other Ambulatory Visit: Payer: Self-pay | Admitting: Internal Medicine

## 2014-05-16 ENCOUNTER — Telehealth: Payer: Self-pay | Admitting: Internal Medicine

## 2014-05-16 MED ORDER — BUDESONIDE-FORMOTEROL FUMARATE 80-4.5 MCG/ACT IN AERO: 2.0000 | INHALATION_SPRAY | Freq: Two times a day (BID) | RESPIRATORY_TRACT | Status: DC

## 2014-05-16 MED ORDER — TIOTROPIUM BROMIDE MONOHYDRATE 18 MCG IN CAPS: 18.0000 ug | ORAL_CAPSULE | Freq: Every day | RESPIRATORY_TRACT | Status: DC

## 2014-05-16 NOTE — Telephone Encounter (Signed)
Called and spoke with pt and she stated that she will need refills of the symbicort and spiriva to be sent to her pharmacy.  She does not want to run out of these.  Refills have been sent and nothing further is needed.

## 2014-05-22 ENCOUNTER — Telehealth: Payer: Self-pay | Admitting: Internal Medicine

## 2014-05-24 NOTE — Telephone Encounter (Signed)
Received PA for Xopenex HFA. Form in MR's look at to be signed on 05/27/14 when he is back in office.

## 2014-05-27 NOTE — Telephone Encounter (Signed)
If I signed and gave it back to [employer] (you), you can close this message without reply

## 2014-05-28 NOTE — Telephone Encounter (Signed)
PA form for Xopenex [facility] signed and faxed to provided number ([phone]). Will await an approval or denial.

## 2014-06-05 NOTE — Telephone Encounter (Signed)
Any updates on PA? Thanks.

## 2014-06-10 NOTE — Telephone Encounter (Signed)
Called and spoke to [place] and the PA for Xopenex has been denied d/t Medicaid being able to cover it. Called and spoke to pt and informed her of status, pt stated she would call her insurance and make sure its covered and then call back to update. Will keep open to allow for pt update.

## 2014-06-12 ENCOUNTER — Telehealth: Payer: Self-pay | Admitting: Internal Medicine

## 2014-06-12 NOTE — Telephone Encounter (Addendum)
ATC pt x 2 NA wcb

## 2014-06-12 NOTE — Telephone Encounter (Signed)
Error.  Duplicate message.  Holly D Pryor °- °

## 2014-06-12 NOTE — Telephone Encounter (Signed)
Pt called back stating that we needed to call [facility] to get PA- I called and spoke with Rica Mote at [facility]-tried for PA over the phone and was told that a clinical review needed to be done. PA was marked URGENT and we should have a response back tomorrow.  I have informed of this; she is aware to call our office back Friday morning if she has not heard from [place]. Will forward to [place] to keep an eye out for approval/denial.

## 2014-06-12 NOTE — Telephone Encounter (Signed)
Pt is asking for an update & asks to be reached at [phone].  Pt states we should have been calling [phone] to verify the Xopenex.  Satira Anis

## 2014-06-18 NOTE — Telephone Encounter (Signed)
PA form received from patients insurance- PA has been approved until 11/14/14. Pt is aware and will have pharmacy fill the RX for her. Nothing more needed at this time. PA form has been sent to scan in EPIC.

## 2014-07-01 ENCOUNTER — Ambulatory Visit (INDEPENDENT_AMBULATORY_CARE_PROVIDER_SITE_OTHER): Payer: PRIVATE HEALTH INSURANCE | Admitting: Internal Medicine

## 2014-07-01 VITALS — BP 130/64 | HR 76 | Ht 68.0 in | Wt 132.0 lb

## 2014-07-01 DIAGNOSIS — F17200 Nicotine dependence, unspecified, uncomplicated: Secondary | ICD-10-CM

## 2014-07-01 DIAGNOSIS — J449 Chronic obstructive pulmonary disease, unspecified: Secondary | ICD-10-CM

## 2014-07-01 DIAGNOSIS — IMO0001 Reserved for inherently not codable concepts without codable children: Secondary | ICD-10-CM

## 2014-07-01 NOTE — Progress Notes (Signed)
Subjective:    Patient ID: Karen Coffey, female    DOB: [birth year], 68 y.o.   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 - PFT on 04/20/13 showed FEV1 at 1.21 L (44%) , ratio 43 , + change with BD (31%)    #Smoker  reports that she has been smoking Cigarettes.  She has a 50 pack-year smoking history. She has never used smokeless tobacco.    OV 03/26/2013 Followup for the above. After cancer diagnosis (Small cell Jan 2013) in JAn 2013 she and daughter transferred care to [facility] due to ? dissatisfaction witih [place] at [facility]. Since  [facility] , she is s/p prophylactic XRT and chemo. Apparently her cancer is under complete remission.   Of note, She does not have PCP and no primary pulmonary specialist. She is now back at [facility] per her hx at request of DR Joie Bimler who has reportedly told her hthat she needs a pulmonologist. She is c/o progressive dyspnea since past 1 year esp last 2 months. DOE for waling 20-30 feet, and talking and changing clothes. Improved by rest and inhalers and oxygen. SHe uses oxygen at night but she also needs recertification. Past year sleeping in recliner due to orthopnea. Only Rx is xopenex. She is also having cough with white sputum that is moderate. COPD cat score is 31 and reflects high symptom burden.  She continues to smoke but reduced to 3 cig per day which she "does not ihaled"    04/20/2013 Follow up w/ PFT  Returns for follow up . Last ov with COPD flare , tx w/ steroids . Feeling much better.  Continues to smoke, we discussed smoking cessation  HadPFT on 04/20/13 showed FEV1 at 1.21 L (44%) , ratio 43 ,  + change with BD  (31%)  We reviewed these results in detail .  She says symbicort and spiriva are helping her breathing a lot.  No fever , chest pain or orthopnea,  Gets swollen ankles in evening, using more salt than usual.  We discussed low salt diet and leg elevation  No calf pain .    MOST IMPORTANT GOAL IS TO QUIT SMOKING  Continue on symbicort and spiriva  We are referring you to pulmonary rehab.  follow up Dr. Chase Caller in 6-8 weeks and As needed    OV 07/23/2013  Followup COPD smoking  - She is much improved after starting Spiriva and Symbicort. CAT score is dropped to 21 and 31. She still has dyspnea for doing yard work. She is interested in pulmonary rehabilitation. She is refusing to start in our office with [facility]  - In terms of smoking she continues to smoke and is not very keen on quitting  - In terms of small cell lung cancer she is on surveillance at [facility] and has an appointment coming up this month   #COPD  -copd stable - Please continue spiriva 1 puff daily -  - Please continue  symbicor 80/4.5, 2 puff twice daily -  - Respect your decision to refuse flu  shot in ourr office; [facility] - ask for high dose flu shot if they have it - I am have referred you to pumonary rehab   #Followup  -6 months or sooner   OV 07/01/2014  Chief Complaint  Patient presents with  . Follow-up    Pt states her breathing is unchanged. Pt states she has good days and bad days. Pt c/o dyspnea with over exertion, dry cough. Pt denies CP/tightness.   FU COPD and smoking in patient with Small cell lung cancer which is under reported remission  COPD: stable. Continues spiriva and symbicort. Some good and bad days. 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 No hx of aecopd since last OV  SMoking:  reports that she has been smoking Cigarettes.  She has a 50 pack-year smoking history. She has never used smokeless tobacco.  She understands need to quit  [facility] cannot seem to find them in care everywhere but she reports having had ct chest since last visit and duplex legs and all normal  Past hx: c.o chronic venous stasis edema; has varicose. By hx DVT ruled out at [facility]      CAT COPD Symptom & Quality of Life Score ([place] trademark) 0 is no burden. 5 is highest burden 03/26/2013 Pre RX 07/23/2013 spiriva and symbicort  Never Cough -> Cough all the time 4 4  No phlegm in chest -> Chest is full of phlegm 5 2  No chest tightness -> Chest feels very tight 0 1  No dyspnea for 1 flight stairs/hill -> Very dyspneic for 1 flight of stairs 5 4  No limitations for ADL at home -> Very limited with ADL at home 5 4  Confident leaving home -> Not at all confident leaving home 3 1  Sleep soundly -> Do not sleep soundly because of lung condition 5 2  Lots of Energy -> No energy at all 3 3  TOTAL Score (max 40)  31 21      Past, Family, Social reviewed: no change since last visit      Review of Systems  Constitutional: Negative for fever and unexpected weight change.  HENT: Negative for congestion, dental problem, ear pain, nosebleeds, postnasal drip, rhinorrhea, sinus pressure, sneezing, sore throat and trouble swallowing.   Eyes: Negative for redness and itching.  Respiratory: Positive for cough and shortness of breath. Negative for chest tightness and wheezing.   Cardiovascular: Negative for palpitations and leg swelling.  Gastrointestinal: Negative for nausea and vomiting.  Genitourinary: Negative for dysuria.  Musculoskeletal: Negative for joint swelling.  Skin: Negative for rash.  Neurological: Negative for headaches.  Hematological: Does not bruise/bleed easily.  Psychiatric/Behavioral: Negative for dysphoric mood. The patient is not nervous/anxious.    Current outpatient prescriptions:budesonide-formoterol (SYMBICORT) 80-4.5 MCG/ACT inhaler, Inhale 2 puffs into the lungs 2 (two) times daily., Disp: 1 Inhaler, Rfl: 1;   levalbuterol (XOPENEX HFA) 45 MCG/ACT inhaler, INHALE 1-2 PUFFS INTO THE LUNGS EVERY 4 HRS AS NEEDED FOR WHEEZING, Disp: 15 g, Rfl: 0;  oxycodone (OXY-IR) 5 MG capsule, Take 5 mg by mouth every 4 (four) hours as needed., Disp: , Rfl:  tiotropium (SPIRIVA) 18 MCG inhalation capsule, Place 1 capsule (18 mcg total) into inhaler and inhale daily., Disp: 30 capsule, Rfl: 1     Objective:   Physical Exam  Filed Vitals:   07/01/14 1558  BP: 130/64  Pulse: 76  Height: 5\' 8"  (1.727 m)  Weight: 132 lb (  59.875 kg)  SpO2: 97%   als reviewed. Constitutional: She is oriented to person, place, and time. She appears well-developed and well-nourished. No distress.  HENT:  Head: Normocephalic and atraumatic.  Right Ear: External ear normal.  Left Ear: External ear normal.  Mouth/Throat: Oropharynx is clear and moist. No oropharyngeal exudate.  Eyes: Conjunctivae and EOM are normal. Pupils are equal, round, and reactive to light. Right eye exhibits no discharge. Left eye exhibits no discharge. No scleral icterus.  Neck: Normal range of motion. Neck supple. No JVD present. No tracheal deviation present. No thyromegaly present.  Cardiovascular: Normal rate, regular rhythm, normal heart sounds and intact distal pulses.  Exam reveals no gallop and no friction rub.   No murmur heard. Pulmonary/Chest: Effort normal and breath sounds normal. No respiratory distress. She has no wheezes. She has no rales. She exhibits no tenderness.  Abdominal: Soft. Bowel sounds are normal. She exhibits no distension and no mass. There is no tenderness. There is no rebound and no guarding.  Musculoskeletal: Normal range of motion. She exhibits no edema and no tenderness.  Lymphadenopathy:    She has no cervical adenopathy.  Neurological: She is alert and oriented to person, place, and time. She has normal reflexes. No cranial nerve deficit. She exhibits normal muscle tone. Coordination normal.  Skin: Skin is warm and dry. No  rash noted. She is not diaphoretic. No erythema. No pallor.  Psychiatric: She has a normal mood and affect. Her behavior is normal. Judgment and thought content normal.              Assessment & Plan:  #COPD  -copd stable - Please continue spiriva 1 puff daily  - Please continue  symbicor 80/4.5, 2 puff twice daily  - use xopenex as needed - Please have flu shot asap this season   #Followup  -7 months or sooner if needed

## 2014-07-01 NOTE — Patient Instructions (Addendum)
#  COPD  -copd stable - Please continue spiriva 1 puff daily  - Please continue  symbicor 80/4.5, 2 puff twice daily  - use xopenex as needed - Please have flu shot asap this season  #smoking  - please work on quitting smoking tobacco asap   #Followup  -7 months or sooner if needed

## 2014-07-02 MED ORDER — LEVALBUTEROL TARTRATE 45 MCG/ACT IN AERO: INHALATION_SPRAY | RESPIRATORY_TRACT | Status: DC

## 2014-07-02 NOTE — Telephone Encounter (Signed)
Pt had appt with MR on 07/01/14 and stated she still has not received her Xopenex, informed pt I would call her insurance and pharmacy and get back to her. Called [facility] and was informed of the approval and [facility]. [facility] and [facility] but the pt was out of refills. Rx sent in for Xopenex. Called and spoke to pt to inform her of the status and she is now able to pick up Rx. Pt verbalized understanding and denied any further questions or concerns at this time.

## 2014-07-08 ENCOUNTER — Encounter: Payer: Self-pay | Admitting: Internal Medicine

## 2014-07-08 NOTE — Assessment & Plan Note (Signed)
Advised to quit smoking

## 2014-07-08 NOTE — Assessment & Plan Note (Signed)
#  COPD  -copd stable - Please continue spiriva 1 puff daily  - Please continue  symbicor 80/4.5, 2 puff twice daily  - use xopenex as needed - Please have flu shot asap this season   #Followup  -7 months or sooner if needed

## 2014-07-14 ENCOUNTER — Other Ambulatory Visit: Payer: Self-pay | Admitting: Internal Medicine

## 2014-07-28 ENCOUNTER — Telehealth: Payer: Self-pay | Admitting: Internal Medicine

## 2014-07-28 NOTE — Telephone Encounter (Signed)
Please document [facility]14  Dr. Brand Males, M.D., [facility].C.P Pulmonary and Critical Care Medicine Staff Physician [place] [facility] Pager: [phone], If no answer or between  15:00h - 7:00h: call [phone]  07/28/2014 9:26 PM

## 2014-07-30 NOTE — Telephone Encounter (Signed)
Immunizations has been updated. Nothing further needed.

## 2014-08-01 ENCOUNTER — Other Ambulatory Visit: Payer: Self-pay | Admitting: Internal Medicine

## 2014-08-01 ENCOUNTER — Telehealth: Payer: Self-pay | Admitting: Internal Medicine

## 2014-08-01 MED ORDER — BUDESONIDE-FORMOTEROL FUMARATE 80-4.5 MCG/ACT IN AERO: 2.0000 | INHALATION_SPRAY | Freq: Two times a day (BID) | RESPIRATORY_TRACT | Status: DC

## 2014-08-01 NOTE — Telephone Encounter (Signed)
symbicort refilled  Spoke with the pt and notified that this was done  Nothing further needed per pt

## 2014-08-02 ENCOUNTER — Other Ambulatory Visit: Payer: Self-pay | Admitting: Internal Medicine

## 2014-09-10 ENCOUNTER — Telehealth: Payer: Self-pay | Admitting: Internal Medicine

## 2014-09-10 NOTE — Telephone Encounter (Signed)
Spoke with the pt  She states "a man from [employer] called" yesterday telling her MR wants her to take ensure  She states that he told her that we know where she can get this for free I advised that I am unsure of where she can get this for free  Will have to check with MR  Please advise thanks!

## 2014-09-12 NOTE — Telephone Encounter (Signed)
Spoke with patient-she is aware that MR is okay with her getting into COPD program. She will contact Barrie Dunker today and get enrolled.

## 2014-09-12 NOTE — Telephone Encounter (Signed)
Maybe Barrie Dunker from [employer] mgmt team. They are doing a research to see if making Copd patients take high protein like ensuer will help them. If the above facts match, ok for her to participate

## 2014-09-25 ENCOUNTER — Telehealth: Payer: Self-pay | Admitting: Internal Medicine

## 2014-09-25 NOTE — Telephone Encounter (Signed)
See phone note from 09/10/14. Pt stated she misplaced the number of [employer]rie Dunker and pt was going to go pick up her Ensure. While speaking with pt she found the phone number. Pt denied any further assistance with the issue. Nothing further needed.

## 2014-11-05 ENCOUNTER — Telehealth: Payer: Self-pay | Admitting: Internal Medicine

## 2014-11-05 MED ORDER — PREDNISONE (PAK) 5 MG PO TABS: 5.0000 mg | ORAL_TABLET | ORAL | Status: DC

## 2014-11-05 NOTE — Telephone Encounter (Signed)
Spoke with pt, c/o sinus congestion, runny nose, increased sob with exertion, prod coug hwith clear mucus X 3 days.  Denies, fever, chest pain.  Is requesting recs.  [facility] [address]  Sending to doc of day as MR is out on vacation.  Dr. Lenna Gilford please advise.  Thanks! Last ov: 07/01/14 Next ov: recall for 3/16  Allergies  Allergen Reactions  . Albuterol Sulfate     tachycardia   Current Outpatient Prescriptions on File Prior to Visit  Medication Sig Dispense Refill  . budesonide-formoterol (SYMBICORT) 80-4.5 MCG/ACT inhaler Inhale 2 puffs into the lungs 2 (two) times daily. 1 Inhaler 5  . levalbuterol (XOPENEX HFA) 45 MCG/ACT inhaler INHALE 1-2 PUFFS INTO THE LUNGS EVERY 4 HRS AS NEEDED FOR WHEEZING 15 g 4  . oxycodone (OXY-IR) 5 MG capsule Take 5 mg by mouth every 4 (four) hours as needed.    Marland Kitchen SPIRIVA HANDIHALER 18 MCG inhalation capsule PLACE 1 CAPSULE (18 MCG TOTAL) INTO INHALER AND INHALE DAILY. 30 capsule 5   No current facility-administered medications on file prior to visit.

## 2014-11-05 NOTE — Telephone Encounter (Signed)
Per SN- Who is filling oxycodone? And why is pt taking the oxycodone? Mucinex OTC 600mg  QID and increase fluids. Call in pred dose pack #1 and ROV with MR in 2 weeks.   Called and spoke to pt. Pt stated she is taking the oxycodone for her lung cancer. Informed pt of the recs per SN. Rx called in to preferred pharmacy. Appt made with MR on 12/05/14. Pt verbalized understanding and denied any further questions or concerns at this time.

## 2014-12-05 ENCOUNTER — Ambulatory Visit: Payer: PRIVATE HEALTH INSURANCE | Admitting: Internal Medicine

## 2014-12-18 ENCOUNTER — Encounter: Payer: Self-pay | Admitting: Internal Medicine

## 2014-12-18 ENCOUNTER — Ambulatory Visit (INDEPENDENT_AMBULATORY_CARE_PROVIDER_SITE_OTHER): Payer: Medicare Other | Admitting: Internal Medicine

## 2014-12-18 VITALS — BP 124/58 | HR 73 | Temp 97.6°F | Ht 68.0 in | Wt 138.6 lb

## 2014-12-18 DIAGNOSIS — J449 Chronic obstructive pulmonary disease, unspecified: Secondary | ICD-10-CM

## 2014-12-18 DIAGNOSIS — Z720 Tobacco use: Secondary | ICD-10-CM

## 2014-12-18 DIAGNOSIS — F17200 Nicotine dependence, unspecified, uncomplicated: Secondary | ICD-10-CM

## 2014-12-18 DIAGNOSIS — Z23 Encounter for immunization: Secondary | ICD-10-CM

## 2014-12-18 NOTE — Progress Notes (Signed)
Subjective:    Patient ID: Karen Coffey, female    DOB: February 06, 1946, 69 y.o.   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 Rx with chemo and prophlyactic XRT to brain   - 1. Carboplatin\Etoposide; Started 11/28/2011   2. PCI completed 06/09/12    - in CR as of MAy 2014 per hx and CT chest oct 2015 at [facility]  #COPD - GOld stage  2...........Marland KitchenChase Caller since may 2014   - Jan 2013: FEv 1.3L/47%, RAtio 47 -> not on RX becauise she did not fu with [facility]. - PFT on 04/20/13 showed FEV1 at 1.21 L (44%) , ratio 43 , + change with BD (31%)    #Smoker  reports that she has been smoking Cigarettes.  She has a 50 pack-year smoking history. She has never used smokeless tobacco.         OV 12/18/2014  Chief Complaint  Patient presents with  . Follow-up    follow up for sick message 11/05/14.  reports breathing is doing well overall, at baseline.  does report some increased congestion w/ white mucus production      COPD: stable. Continues spiriva and symbicort. Some good and bad days. 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 No hx of aecopd since last OV. Today being damp she is a feeling bit more dyspneic but denies worsening cough or sputum. NO chest pain   SMoking:  reports that she has been smoking Pipe.  She has never used smokeless tobacco. SHe has quit conventional cigarettes. She loves her pipe and wont quit  Cancer: reviewd [facility] records. CT chest Oct 2015 and clinic visit oct 2015 - cancer under remissioin. ECOG 1    Immunization History  Administered Date(s) Administered  . Influenza Split 10/04/2011, 09/15/2012, 07/21/2014       Review of Systems    Ms.,   Current outpatient prescriptions:  .  budesonide-formoterol (SYMBICORT) 80-4.5 MCG/ACT inhaler, Inhale 2 puffs into the lungs 2 (two) times daily., Disp: 1 Inhaler, Rfl: 5 .  levalbuterol (XOPENEX HFA) 45 MCG/ACT  inhaler, INHALE 1-2 PUFFS INTO THE LUNGS EVERY 4 HRS AS NEEDED FOR WHEEZING, Disp: 15 g, Rfl: 4 .  oxycodone (OXY-IR) 5 MG capsule, Take 5 mg by mouth every 4 (four) hours as needed., Disp: , Rfl:  .  SPIRIVA HANDIHALER 18 MCG inhalation capsule, PLACE 1 CAPSULE (18 MCG TOTAL) INTO INHALER AND INHALE DAILY., Disp: 30 capsule, Rfl: 5  Objective:   Physical Exam  Constitutional: She is oriented to person, place, and time. She appears well-developed and well-nourished. No distress.  HENT:  Head: Normocephalic and atraumatic.  Right Ear: External ear normal.  Left Ear: External ear normal.  Mouth/Throat: Oropharynx is clear and moist. No oropharyngeal exudate.  Alopecia  - has wig  Eyes: Conjunctivae and EOM are normal. Pupils are equal, round, and reactive to light. Right eye exhibits no discharge. Left eye exhibits no discharge. No scleral icterus.  Neck: Normal range of motion. Neck supple. No JVD present. No tracheal deviation present. No thyromegaly present.  Cardiovascular: Normal rate, regular rhythm, normal heart sounds and intact distal pulses.  Exam reveals no gallop and no friction rub.   No murmur heard. Pulmonary/Chest: Effort normal and breath sounds normal. No respiratory distress. She has no wheezes. She has no rales. She exhibits no tenderness.  Abdominal: Soft. Bowel sounds are normal. She exhibits no  distension and no mass. There is no tenderness. There is no rebound and no guarding.  Musculoskeletal: Normal range of motion. She exhibits no edema or tenderness.  Lymphadenopathy:    She has no cervical adenopathy.  Neurological: She is alert and oriented to person, place, and time. She has normal reflexes. No cranial nerve deficit. She exhibits normal muscle tone. Coordination normal.  Skin: Skin is warm and dry. No rash noted. She is not diaphoretic. No erythema. No pallor.  Psychiatric: She has a normal mood and affect. Her behavior is normal. Judgment and thought content  normal.  Vitals reviewed.   Filed Vitals:   12/18/14 1655  BP: 124/58  Pulse: 73  Temp: 97.6 F (36.4 C)  TempSrc: Oral  Height: 5\' 8"  (1.727 m)  Weight: 138 lb 9.6 oz (62.869 kg)  SpO2: 97%        Assessment & Plan:  #COPD  -copd stable - Please continue spiriva 1 puff daily  - Please continue  symbicor 80/4.5, 2 puff twice daily  - use xopenex as needed - [facility] [address] and find out pneumonia vaccine status - if not, give PREVNAR 12/18/2014    #smoking  - please work on quitting smoking tobacco asap   #Followup  -7 months or sooner if needed   Dr. Brand Males, M.D., [facility].C.P Pulmonary and Critical Care Medicine Staff Physician [place] [facility] Pager: [phone], If no answer or between  15:00h - 7:00h: call [phone]  12/18/2014 5:23 PM

## 2014-12-18 NOTE — Patient Instructions (Addendum)
#  COPD  -copd stable - Please continue spiriva 1 puff daily  - Please continue  symbicor 80/4.5, 2 puff twice daily  - use xopenex as needed - [facility] [address] and find out pneumonia vaccine status - if not, give PREVNAR 12/18/2014    #smoking  - please work on quitting smoking tobacco asap   #Followup  -7 months or sooner if needed

## 2014-12-18 NOTE — Addendum Note (Signed)
Addended by: Parke Poisson E on: 12/18/2014 05:43 PM   Modules accepted: Orders

## 2014-12-19 ENCOUNTER — Telehealth: Payer: Self-pay | Admitting: Internal Medicine

## 2014-12-19 NOTE — Telephone Encounter (Signed)
Attempted to call pt to let her know that I will forward this message along to MR but she did not answer and I could not leave a message.  Will route to MR so that he is aware of her appreciation.

## 2015-01-03 ENCOUNTER — Telehealth: Payer: Self-pay | Admitting: Internal Medicine

## 2015-01-03 MED ORDER — AZITHROMYCIN 250 MG PO TABS: ORAL_TABLET | ORAL | Status: DC

## 2015-01-03 NOTE — Telephone Encounter (Signed)
Called and spoke with pt and she stated that she is having a cough with clear sputum.  She is wanting to know if something can be called in to her pharmacy to help with this.  Pt stated that she is not having fever or any other symptoms.  MR please advise. Thanks  Allergies  Allergen Reactions  . Albuterol Sulfate     tachycardia    Current Outpatient Prescriptions on File Prior to Visit  Medication Sig Dispense Refill  . budesonide-formoterol (SYMBICORT) 80-4.5 MCG/ACT inhaler Inhale 2 puffs into the lungs 2 (two) times daily. 1 Inhaler 5  . levalbuterol (XOPENEX HFA) 45 MCG/ACT inhaler INHALE 1-2 PUFFS INTO THE LUNGS EVERY 4 HRS AS NEEDED FOR WHEEZING 15 g 4  . oxycodone (OXY-IR) 5 MG capsule Take 5 mg by mouth every 4 (four) hours as needed.    Marland Kitchen SPIRIVA HANDIHALER 18 MCG inhalation capsule PLACE 1 CAPSULE (18 MCG TOTAL) INTO INHALER AND INHALE DAILY. 30 capsule 5   No current facility-administered medications on file prior to visit.

## 2015-01-03 NOTE — Telephone Encounter (Signed)
Call in Z pak She can take OTC cough meds If not better or worse go to ER or call back or visut [place] next week

## 2015-01-03 NOTE — Telephone Encounter (Signed)
Called and spoke to pt. Informed pt of the recs per MR. Rx sent to preferred pharmacy. Pt verbalized understanding and denied any further questions or concerns at this time.

## 2015-01-10 ENCOUNTER — Telehealth: Payer: Self-pay | Admitting: Internal Medicine

## 2015-01-10 MED ORDER — PREDNISONE 10 MG PO TABS
ORAL_TABLET | ORAL | Status: DC
Start: 2015-01-10 — End: 2015-02-06

## 2015-01-10 MED ORDER — AZITHROMYCIN 250 MG PO TABS: ORAL_TABLET | ORAL | Status: DC

## 2015-01-10 NOTE — Telephone Encounter (Signed)
Spoke with pt. Reports that her cough has come back since finishing Zpack. Cough is non-productive. Denies chest tightness or SOB. Would like another Zpack sent in.  MR - please advise.

## 2015-01-10 NOTE — Telephone Encounter (Signed)
Called and spoke to pt. Informed pt of the recs per MR. Rx sent to preferred pharmacy. Pt verbalized understanding and denied any further questions or concerns at this time.

## 2015-01-10 NOTE — Telephone Encounter (Signed)
okl send another z pak but also   Please take prednisone 40 mg x1 day, then 30 mg x1 day, then 20 mg x1 day, then 10 mg x1 day, and then 5 mg x1 day and stop

## 2015-02-05 ENCOUNTER — Telehealth: Payer: Self-pay | Admitting: Internal Medicine

## 2015-02-05 NOTE — Telephone Encounter (Signed)
Busy x 2. 

## 2015-02-06 MED ORDER — DOXYCYCLINE HYCLATE 100 MG PO TABS: 100.0000 mg | ORAL_TABLET | Freq: Two times a day (BID) | ORAL | Status: DC

## 2015-02-06 MED ORDER — PREDNISONE 10 MG PO TABS: ORAL_TABLET | ORAL | Status: DC

## 2015-02-06 NOTE — Telephone Encounter (Signed)
Called pt. She reports she is taking her medications reg. RX's sent in. appt scheduled to see MR 4/12

## 2015-02-06 NOTE — Telephone Encounter (Signed)
She is having recurrent aecopd . She just finished zpak and pred last month.  Not sure why. ASk her if she is taking spiriva and symbicort regularly? If not she should  For now  Take doxycycline 100mg  po twice daily x 5 days; take after meals and avoid sunlight  Please take prednisone 40 mg x1 day, then 30 mg x1 day, then 20 mg x1 day, then 10 mg x1 day, and then 5 mg x1 day and stop  Have her come in. [facility] might need to be in a ressearch study to better understand her recurrent aecopd

## 2015-02-06 NOTE — Telephone Encounter (Signed)
Called and spoke to pt. Pt c/o persistent cough with clear mucus and slight increase in SOB when active or coughing. Pt denies CP/tightness, f/c/s. Pt stated she feels fine but the cough has been really bothering her lately.   MR please advise.   Allergies  Allergen Reactions  . Albuterol Sulfate     tachycardia

## 2015-02-14 ENCOUNTER — Other Ambulatory Visit: Payer: Self-pay | Admitting: Internal Medicine

## 2015-02-25 ENCOUNTER — Encounter: Payer: Self-pay | Admitting: Internal Medicine

## 2015-02-25 ENCOUNTER — Ambulatory Visit (INDEPENDENT_AMBULATORY_CARE_PROVIDER_SITE_OTHER): Payer: Medicare Other | Admitting: Internal Medicine

## 2015-02-25 VITALS — BP 98/52 | HR 94 | Ht 68.0 in | Wt 134.0 lb

## 2015-02-25 DIAGNOSIS — Z720 Tobacco use: Secondary | ICD-10-CM

## 2015-02-25 DIAGNOSIS — J441 Chronic obstructive pulmonary disease with (acute) exacerbation: Secondary | ICD-10-CM

## 2015-02-25 DIAGNOSIS — J449 Chronic obstructive pulmonary disease, unspecified: Secondary | ICD-10-CM

## 2015-02-25 DIAGNOSIS — F17200 Nicotine dependence, unspecified, uncomplicated: Secondary | ICD-10-CM

## 2015-02-25 MED ORDER — AZITHROMYCIN 250 MG PO TABS: ORAL_TABLET | ORAL | Status: DC

## 2015-02-25 MED ORDER — PREDNISONE 10 MG PO TABS: ORAL_TABLET | ORAL | Status: DC

## 2015-02-25 MED ORDER — TIOTROPIUM BROMIDE MONOHYDRATE 2.5 MCG/ACT IN AERS: 1.0000 | INHALATION_SPRAY | Freq: Every day | RESPIRATORY_TRACT | Status: DC

## 2015-02-25 MED ORDER — FLUTICASONE FUROATE-VILANTEROL 100-25 MCG/INH IN AEPB: 1.0000 | INHALATION_SPRAY | Freq: Every day | RESPIRATORY_TRACT | Status: DC

## 2015-02-25 NOTE — Progress Notes (Signed)
Subjective:    Patient ID: Karen Coffey, female    DOB: 06/14/1946, 69 y.o.   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 Carboplatin\Etoposide; Started 11/28/2011   2. PCI completed 06/09/12    - in CR as of MAy 2014 per hx and CT chest oct 2015 at [facility]  #COPD - GOld stage  2...........Marland KitchenChase Caller since may 2014   - Jan 2013: FEv 1.3L/47%, RAtio 47 -> not on RX becauise she did not fu with [facility]. - PFT on 04/20/13 showed FEV1 at 1.21 L (44%) , ratio 43 , + change with BD (31%)    #Smoker   reports that she has been smoking Pipe.  She has never used smokeless tobacco.   #AECOP{D Feb 2016 - tepelphone zpak and pred March 2016 - doxy and pred over telephone      OV 02/25/2015  Chief Complaint  Patient presents with  . Follow-up    Pt here to discuss COPD research. Pt c/o DOE, prod cough with clear mucus. Pt denies CP/tightness.       COPD: . Continues spiriva and symbicort. Some good and bad days. However since the onset 2016 she's had 2 exacerbations for which treatment was called in with antibiotic and prednisone over the telephone once and every 2016 and second one in March 2016. With these help significantly. She now again feels that she is in another exacerbation with worsening cough, wheezing, chest tightness and mild increase in sputum volumes for the last few to several days. She insists that she is compliant with the Spiriva and Symbicort. However she is now smoking a pipe.   SMoking:  reports that she has been smoking Pipe.  She has never used smokeless tobacco. SHe has quit conventional cigarettes. She loves her pipe and wont quit  Cancer: reviewd [facility] records. CT chest Oct 2015 and clinic visit oct 2015 - cancer under remissioin. ECOG 1 year follow-up CT scan of the chest due sometime now  Review of Systems  Constitutional:  Negative for fever and unexpected weight change.  HENT: Negative for congestion, dental problem, ear pain, nosebleeds, postnasal drip, rhinorrhea, sinus pressure, sneezing, sore throat and trouble swallowing.   Eyes: Negative for redness and itching.  Respiratory: Positive for cough and shortness of breath. Negative for chest tightness and wheezing.   Cardiovascular: Negative for palpitations and leg swelling.  Gastrointestinal: Negative for nausea and vomiting.  Genitourinary: Negative for dysuria.  Musculoskeletal: Negative for joint swelling.  Skin: Negative for rash.  Neurological: Negative for headaches.  Hematological: Does not bruise/bleed easily.  Psychiatric/Behavioral: Negative for dysphoric mood. The patient is not nervous/anxious.        Objective:   Physical Exam  Constitutional: She is oriented to person, place, and time. She appears well-developed and well-nourished. No distress.  Body mass index is 20.38 kg/(m^2).   HENT:  Head: Normocephalic and atraumatic.  Right Ear: External ear normal.  Left Ear: External ear normal.  Mouth/Throat: Oropharynx is clear and moist. No oropharyngeal exudate.  Eyes: Conjunctivae and EOM are normal. Pupils are equal, round, and reactive to light. Right eye exhibits no discharge. Left eye exhibits no discharge. No scleral icterus.  Neck: Normal range of motion. Neck supple. No JVD present. No tracheal deviation present. No thyromegaly present.  Cardiovascular: Normal rate, regular rhythm, normal heart  sounds and intact distal pulses.  Exam reveals no gallop and no friction rub.   No murmur heard. Pulmonary/Chest: Effort normal and breath sounds normal. No respiratory distress. She has no wheezes. She has no rales. She exhibits no tenderness.  Coughs periopdically Coarse breath sounds +  Abdominal: Soft. Bowel sounds are normal. She exhibits no distension and no mass. There is no tenderness. There is no rebound and no guarding.    Musculoskeletal: Normal range of motion. She exhibits no edema or tenderness.  Lymphadenopathy:    She has no cervical adenopathy.  Neurological: She is alert and oriented to person, place, and time. She has normal reflexes. No cranial nerve deficit. She exhibits normal muscle tone. Coordination normal.  Skin: Skin is warm and dry. No rash noted. She is not diaphoretic. No erythema. No pallor.  Psychiatric: She has a normal mood and affect. Her behavior is normal. Judgment and thought content normal.  Vitals reviewed.  Filed Vitals:   02/25/15 1453  BP: 98/52  Pulse: 94  Height: 5\' 8"  (1.727 m)  Weight: 134 lb (60.782 kg)  SpO2: 98%          Assessment & Plan:     ICD-9-CM ICD-10-CM   1. COPD, moderate 496 J44.9   2. Smoking 305.1 Z72.0   3. COPD exacerbation 491.21 J44.1      #COPD  -copd in exacerbation - Please continue spiriva 1 puff daily but change to respimat format in the morning - Please stop symbicort . Instead start BREO 1 puff daily at night - use xopenex as needed - Try Z PAK - Take prednisone 40 mg daily x 2 days, then 20mg  daily x 2 days, then 10mg  daily x 2 days, then 5mg  daily x 2 days and stop    #smoking  - please work on quitting smoking tobacco asap   #Followup  -2 months or sooner if needed with NP Tammy Parrett - to see reponse to new inhlaers - spirometry pre- and post -BD with June Leap in 2 months  - consider roflumilast v copd trials if symptoms/ excarbations persist despite change in inhalers - ensure CT chest in April 2016/May 2016 at [place] does not show cancer recurrence   Dr. Brand Males, M.D., [facility].C.P Pulmonary and Critical Care Medicine Staff Physician [facility] Pager: [phone], If no answer or between  15:00h - 7:00h: call [phone]  02/25/2015 3:31 PM

## 2015-02-25 NOTE — Patient Instructions (Addendum)
ICD-9-CM ICD-10-CM   1. COPD, moderate 496 J44.9   2. Smoking 305.1 Z72.0   3. COPD exacerbation 491.21 J44.1      #COPD  -copd in exacerbation - Please continue spiriva 1 puff daily but change to respimat format in the morning - Please stop symbicort . Instead start BREO 1 puff daily at night - use xopenex as needed - Try Z PAK - Take prednisone 40 mg daily x 2 days, then 20mg  daily x 2 days, then 10mg  daily x 2 days, then 5mg  daily x 2 days and stop    #smoking  - please work on quitting smoking tobacco asap   #Followup  -2 months or sooner if needed with NP Tammy Parrett - to see reponse to new inhlaers - spirometry pre- and post -BD with June Leap in 2 months  - consider roflumilast v copd trials if symptoms/ excarbations persist despite change in inhalers - - ensure CT chest in April 2016/May 2016 at [facility] does not show cancer recurrence

## 2015-02-26 ENCOUNTER — Telehealth: Payer: Self-pay | Admitting: Internal Medicine

## 2015-02-26 NOTE — Telephone Encounter (Signed)
Patient was unsure how to take her Prednisone.  She wanted me to go over it with her.  Discussed the way she should take her Prednisone per MR's orders. Nothing further needed.

## 2015-03-03 ENCOUNTER — Telehealth: Payer: Self-pay | Admitting: Internal Medicine

## 2015-03-03 NOTE — Telephone Encounter (Signed)
Noted  

## 2015-03-17 ENCOUNTER — Telehealth: Payer: Self-pay | Admitting: Internal Medicine

## 2015-03-17 ENCOUNTER — Other Ambulatory Visit: Payer: Self-pay | Admitting: Internal Medicine

## 2015-03-17 MED ORDER — FLUTICASONE FUROATE-VILANTEROL 100-25 MCG/INH IN AEPB: 1.0000 | INHALATION_SPRAY | Freq: Every day | RESPIRATORY_TRACT | Status: DC

## 2015-03-17 MED ORDER — TIOTROPIUM BROMIDE MONOHYDRATE 2.5 MCG/ACT IN AERS: 2.0000 | INHALATION_SPRAY | Freq: Every day | RESPIRATORY_TRACT | Status: DC

## 2015-03-17 NOTE — Telephone Encounter (Signed)
Called spoke with pt. She needs refills on her spiriva resp and breo. I have sent this in for pt. Also aware sample left for pick up per lindsay. Nothing further needed

## 2015-03-17 NOTE — Telephone Encounter (Signed)
Attempted to call pt. No answer, no option to leave a message. Samples have been left at the front desk. Need to clarify what medication she needs a refill on.

## 2015-03-17 NOTE — Telephone Encounter (Signed)
[phone] pt calling back

## 2015-03-19 ENCOUNTER — Telehealth: Payer: Self-pay | Admitting: Internal Medicine

## 2015-03-19 NOTE — Telephone Encounter (Signed)
Per 02/25/15 OV: COPD  -copd in exacerbation - Please continue spiriva 1 puff daily but change to respimat format in the morning - Please stop symbicort . Instead start BREO 1 puff daily at night - use xopenex as needed --  Called spoke with pt and made aware of above. They have gave her old RX from 02/14/15. She will call the pharm to see if they will take back the unopened package. Nothing further needed

## 2015-04-18 ENCOUNTER — Encounter: Payer: Self-pay | Admitting: Adult Health

## 2015-04-18 ENCOUNTER — Ambulatory Visit (INDEPENDENT_AMBULATORY_CARE_PROVIDER_SITE_OTHER): Payer: Medicare Other | Admitting: Adult Health

## 2015-04-18 VITALS — BP 114/60 | HR 69 | Temp 97.6°F | Ht 68.0 in | Wt 138.0 lb

## 2015-04-18 DIAGNOSIS — Z720 Tobacco use: Secondary | ICD-10-CM | POA: Diagnosis not present

## 2015-04-18 DIAGNOSIS — J449 Chronic obstructive pulmonary disease, unspecified: Secondary | ICD-10-CM

## 2015-04-18 DIAGNOSIS — F17200 Nicotine dependence, unspecified, uncomplicated: Secondary | ICD-10-CM

## 2015-04-18 NOTE — Progress Notes (Signed)
Subjective:    Patient ID: Karen Coffey, female    DOB: 02-29-1946, 69 y.o.   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 Carboplatin\Etoposide; Started 11/28/2011   2. PCI completed 06/09/12    - in CR as of MAy 2014 per hx and CT chest oct 2015 at [facility]  #COPD - GOld stage  2...........Marland KitchenChase Caller since may 2014   - Jan 2013: FEv 1.3L/47%, RAtio 47 -> not on RX becauise she did not fu with [facility]. - PFT on 04/20/13 showed FEV1 at 1.21 L (44%) , ratio 43 , + change with BD (31%)    #Smoker   reports that she has been smoking Pipe.  She has never used smokeless tobacco.   #AECOP{D Feb 2016 - tepelphone zpak and pred March 2016 - doxy and pred over telephone      OV 02/25/2015  Chief Complaint  Patient presents with  . Follow-up    Pt here to discuss COPD research. Pt c/o DOE, prod cough with clear mucus. Pt denies CP/tightness.       COPD: . Continues spiriva and symbicort. Some good and bad days. However since the onset 2016 she's had 2 exacerbations for which treatment was called in with antibiotic and prednisone over the telephone once and every 2016 and second one in March 2016. With these help significantly. She now again feels that she is in another exacerbation with worsening cough, wheezing, chest tightness and mild increase in sputum volumes for the last few to several days. She insists that she is compliant with the Spiriva and Symbicort. However she is now smoking a pipe.   SMoking:  reports that she has been smoking Pipe.  She has never used smokeless tobacco. SHe has quit conventional cigarettes. She loves her pipe and wont quit  Cancer: reviewd [facility] records. CT chest Oct 2015 and clinic visit oct 2015 - cancer under remissioin. ECOG 1 year follow-up CT scan of the chest due sometime now   04/18/2015 Follow up : COPD , SMall  cell Lung cancer - Nov 17, 2011 Pt walked in office today for follow up visit.  Pt changed to BREO and Spiriva last ov  Continues to smoke, cessation discussed.  Does get winded with some activity.  No chest pain, orthopnea, edema or hemoptysis.   Serial CT chest done 01/2015 (reviewed from care everywhere) .  Showed no evidence of disease reoccurence. , advance emphysema .    Review of Systems  Constitutional: Negative for fever and unexpected weight change.  HENT: Negative for congestion, dental problem, ear pain, nosebleeds, postnasal drip, rhinorrhea, sinus pressure, sneezing, sore throat and trouble swallowing.   Eyes: Negative for redness and itching.  Respiratory . Negative for chest tightness and wheezing.   Cardiovascular: Negative for palpitations and leg swelling.  Gastrointestinal: Negative for nausea and vomiting.  Genitourinary: Negative for dysuria.  Musculoskeletal: Negative for joint swelling.  Skin: Negative for rash.  Neurological: Negative for headaches.  Hematological: Does not bruise/bleed easily.  Psychiatric/Behavioral: Negative for dysphoric mood. The patient is not nervous/anxious.        Objective:   Physical Exam  Constitutional: She is oriented to person, place, and time. She appears well-developed and well-nourished. No distress.      HENT:  Head: Normocephalic and atraumatic.  Right Ear: External ear normal.  Left Ear: External ear normal.  Mouth/Throat: Oropharynx is clear and moist. No oropharyngeal exudate.  Eyes: Conjunctivae and EOM are normal. Pupils are equal, round, and reactive to light. Right eye exhibits no discharge. Left eye exhibits no discharge. No scleral icterus.  Neck: Normal range of motion. Neck supple. No JVD present. No tracheal deviation present. No thyromegaly present.  Cardiovascular: Normal rate, regular rhythm, normal heart sounds and intact distal pulses.  Exam reveals no gallop and no friction rub.   No murmur  heard. Pulmonary/Chest: Effort normal and breath sounds normal. No respiratory distress. She has no wheezes. She has no rales. She exhibits no tenderness.  Coarse breath sounds +  Abdominal: Soft. Bowel sounds are normal. She exhibits no distension and no mass. There is no tenderness. There is no rebound and no guarding.  Musculoskeletal: Normal range of motion. She exhibits no edema or tenderness.  Lymphadenopathy:    She has no cervical adenopathy.  Neurological: She is alert and oriented to person, place, and time. She has normal reflexes. No cranial nerve deficit. She exhibits normal muscle tone. Coordination normal.  Skin: Skin is warm and dry. No rash noted. She is not diaphoretic. No erythema. No pallor.  Psychiatric: She has a normal mood and affect. Her behavior is normal. Judgment and thought content normal.           Assessment & Plan:

## 2015-04-18 NOTE — Patient Instructions (Signed)
MOST IMPORTANT GOAL IS TO QUIT SMOKING  Continue on BREO  and spiriva  Follow up Dr. Chase Caller in 3 months and As needed

## 2015-04-24 NOTE — Assessment & Plan Note (Signed)
Cessation encouarged

## 2015-04-24 NOTE — Assessment & Plan Note (Signed)
Compensated on present regimen  Needs to quit smoking   Plan  MOST IMPORTANT GOAL IS TO QUIT SMOKING  Continue on BREO  and spiriva  Follow up Dr. Chase Caller in 3 months and As needed

## 2015-04-28 ENCOUNTER — Ambulatory Visit: Payer: Medicare Other | Admitting: Adult Health

## 2015-06-06 ENCOUNTER — Other Ambulatory Visit: Payer: Self-pay | Admitting: Internal Medicine

## 2015-06-06 DIAGNOSIS — Z1231 Encounter for screening mammogram for malignant neoplasm of breast: Secondary | ICD-10-CM

## 2015-06-23 ENCOUNTER — Ambulatory Visit (HOSPITAL_COMMUNITY)
Admission: RE | Admit: 2015-06-23 | Discharge: 2015-06-23 | Disposition: A | Discharge status: Home / Self Care | Payer: Medicare Other | Source: Ambulatory Visit | Attending: Internal Medicine | Admitting: Internal Medicine

## 2015-06-23 DIAGNOSIS — Z1231 Encounter for screening mammogram for malignant neoplasm of breast: Secondary | ICD-10-CM | POA: Insufficient documentation

## 2015-06-30 ENCOUNTER — Telehealth: Payer: Self-pay | Admitting: Internal Medicine

## 2015-06-30 NOTE — Telephone Encounter (Signed)
ATC PT line rang numerous times and NA and no VM WCB

## 2015-06-30 NOTE — Telephone Encounter (Signed)
According to epic it states the ordering user: Nino Glow (? Where she is from) And authorizing provider is Dr. Chase Caller

## 2015-06-30 NOTE — Telephone Encounter (Signed)
Patient called to advise she is having a diagnostic mammogram and states she was told to call Dr. Chase Caller.  She can be reached at [phone].

## 2015-06-30 NOTE — Telephone Encounter (Signed)
No idea who Karen Coffey - this will need an investigation  . For now if radiology dept wants this order urgent then we can do it

## 2015-06-30 NOTE — Telephone Encounter (Signed)
ATC pt line would ring then went to fast busy signal. [facility]

## 2015-06-30 NOTE — Telephone Encounter (Signed)
I am not pcp . PCP is No PCP Per Patient . So who ordered the mammogram in 1st place? Why are they calling us

## 2015-06-30 NOTE — Telephone Encounter (Signed)
Patient says that she had a mammogram, it was fine, however, while patient was having mammogram, she noted a sore spot on the left breast, they wanted to do a diagnostic mammogram and needs order from Dr. Chase Caller to do Diagnostic testing.  MR - please advise.

## 2015-07-01 NOTE — Telephone Encounter (Signed)
ATC PT NA line rang numerous times and no VM. WCB

## 2015-07-02 NOTE — Telephone Encounter (Signed)
Patient called back to advise that [facility] is going to send her information about her mammogram within 10 days, she says it's been within 6-7 days.  Patient can be reached at [phone].

## 2015-07-02 NOTE — Telephone Encounter (Signed)
Called and spoke to pt. Pt states she is unsure who called regarding the diagnostic mammogram. I called [facility] and [facility] and neither place is sure who called pt regarding further studies. Results have not been released to Epic yet. Pt stated she would like to wait till results come back then decide what needs to be done.   Will forward to MR as FYI.

## 2015-07-03 NOTE — Telephone Encounter (Signed)
Called and spoke to pt. Informed pt of the recs per MR. Pt states she does not have a PCP. Pt requested number for [facility], number given for [facility] at [facility]. Pt verbalized understanding and denied any further questions or concerns at this time.

## 2015-07-03 NOTE — Telephone Encounter (Signed)
She needs to address hmer mammogram with her PCP - none listed in our computer . I wont address her mammogram issue. Closing message

## 2015-07-07 ENCOUNTER — Telehealth: Payer: Self-pay | Admitting: Internal Medicine

## 2015-07-07 NOTE — Telephone Encounter (Signed)
Spoke with pt. States that she has an appointment with Dr. Doug Sou next month. She will have her address this then. Nothing further was needed.

## 2015-07-20 ENCOUNTER — Other Ambulatory Visit: Payer: Self-pay | Admitting: Internal Medicine

## 2015-07-21 ENCOUNTER — Emergency Department (HOSPITAL_COMMUNITY): Payer: Medicare Other

## 2015-07-21 ENCOUNTER — Emergency Department (HOSPITAL_COMMUNITY)
Admission: EM | Admit: 2015-07-21 | Discharge: 2015-07-21 | Disposition: A | Discharge status: Home / Self Care | Payer: Medicare Other | Attending: Emergency Medicine | Admitting: Emergency Medicine

## 2015-07-21 ENCOUNTER — Encounter (HOSPITAL_COMMUNITY): Payer: Self-pay | Admitting: *Deleted

## 2015-07-21 DIAGNOSIS — W172XXA Fall into hole, initial encounter: Secondary | ICD-10-CM | POA: Insufficient documentation

## 2015-07-21 DIAGNOSIS — Y9389 Activity, other specified: Secondary | ICD-10-CM | POA: Insufficient documentation

## 2015-07-21 DIAGNOSIS — Y92007 Garden or yard of unspecified non-institutional (private) residence as the place of occurrence of the external cause: Secondary | ICD-10-CM | POA: Insufficient documentation

## 2015-07-21 DIAGNOSIS — Z720 Tobacco use: Secondary | ICD-10-CM | POA: Diagnosis not present

## 2015-07-21 DIAGNOSIS — S42295A Other nondisplaced fracture of upper end of left humerus, initial encounter for closed fracture: Secondary | ICD-10-CM | POA: Diagnosis not present

## 2015-07-21 DIAGNOSIS — Y998 Other external cause status: Secondary | ICD-10-CM | POA: Insufficient documentation

## 2015-07-21 DIAGNOSIS — Z79899 Other long term (current) drug therapy: Secondary | ICD-10-CM | POA: Insufficient documentation

## 2015-07-21 DIAGNOSIS — Z7951 Long term (current) use of inhaled steroids: Secondary | ICD-10-CM | POA: Insufficient documentation

## 2015-07-21 DIAGNOSIS — J449 Chronic obstructive pulmonary disease, unspecified: Secondary | ICD-10-CM | POA: Insufficient documentation

## 2015-07-21 DIAGNOSIS — S4992XA Unspecified injury of left shoulder and upper arm, initial encounter: Secondary | ICD-10-CM | POA: Diagnosis present

## 2015-07-21 DIAGNOSIS — S42302A Unspecified fracture of shaft of humerus, left arm, initial encounter for closed fracture: Secondary | ICD-10-CM

## 2015-07-21 MED ORDER — HYDROCODONE-ACETAMINOPHEN 5-325 MG PO TABS: 1.0000 | ORAL_TABLET | Freq: Four times a day (QID) | ORAL | Status: DC | PRN

## 2015-07-21 NOTE — Progress Notes (Signed)
CSW met with patient at bedside. There was no family present. Patient confirms that she comes from home and lives alone. Also, she confirms that she presents to [facility] due to fall. Patient states " There is a whole in my yard". Patient informed CSW that she tripped.  Also, patient informed CSW that her L shoulder is in pain as a result.  Patient informed CSW that she completes all of her ADL's independently. Patient states that she feels safe to return home. She states that she is not interested in a facility or home health.   Patient informed CSW that she has a good support system, which consist of her son Jenny Reichmann. She states that Jenny Reichmann is currently living in [place].  Patient informed CSW that she does not have any questions at this time.  Son/John [phone]  Willette Brace [phone] ED CSW 07/21/2015 7:19 PM

## 2015-07-21 NOTE — Discharge Instructions (Signed)
Shoulder Fracture You have a fractured humerus (bone in the upper arm) at the shoulder just below the ball of the shoulder joint. Most of the time the bones of a broken shoulder are in an acceptable position. Usually the injury can be treated with a shoulder immobilizer or sling and swath bandage. These devices support the arm and prevent any shoulder movement. If the bones are not in a good position, then surgery is sometimes needed. Shoulder fractures usually cause swelling, pain, and discoloration around the upper arm initially. They heal in 8-12 weeks with proper treatment. Rest in bed or a reclining chair as long as your shoulder is very painful. Sitting up generally results in less pain at the fracture site. Do not remove your shoulder bandage until your caregiver approves. You may apply ice packs over the shoulder for 20-30 minutes every 2 hours for the next 2-3 days to reduce the pain and swelling. Use your pain medicine as prescribed.  SEEK IMMEDIATE MEDICAL CARE IF:  You develop severe shoulder pain unrelieved by rest and taking pain medicine.  You have pain, numbness, tingling, or weakness in the hand or wrist.  You develop shortness of breath, chest pain, severe weakness, or fainting.  You have severe pain with motion of the fingers or wrist. MAKE SURE YOU:   Understand these instructions.  Will watch your condition.  Will get help right away if you are not doing well or get worse. Document Released: 12/09/2004 Document Revised: 01/24/2012 Document Reviewed: 02/19/2009 [facility] Patient Information 2015 [place], [place]. This information is not intended to replace advice given to you by your health care provider. Make sure you discuss any questions you have with your health care provider.

## 2015-07-21 NOTE — ED Provider Notes (Signed)
CSN: [id number]     Arrival date & time 07/21/15  1423 History   First MD Initiated Contact with Patient 07/21/15 1900     Chief Complaint  Patient presents with  . Fall  . Shoulder Pain     (Consider location/radiation/quality/duration/timing/severity/associated sxs/prior Treatment) Patient is a 69 y.o. female presenting with fall and shoulder pain. The history is provided by the patient.  Fall This is a new problem. The current episode started 1 to 2 hours ago. The problem occurs constantly. The problem has not changed since onset.Pertinent negatives include no chest pain, no abdominal pain and no shortness of breath. Nothing aggravates the symptoms. Nothing relieves the symptoms. She has tried nothing for the symptoms. The treatment provided no relief.  Shoulder Pain Location:  Shoulder Time since incident:  7 hours Injury: yes   Mechanism of injury: fall   Shoulder location:  L shoulder Pain details:    Quality:  Aching   Radiates to:  Does not radiate   Severity:  Moderate   Onset quality:  Gradual   Timing:  Constant Chronicity:  New   Past Medical History  Diagnosis Date  . COPD (chronic obstructive pulmonary disease)   . Pleurisy 2008    left lung  . Lung mass    Past Surgical History  Procedure Laterality Date  . Tubal ligation  1986   Family History  Problem Relation Age of Onset  . Asthma    . Heart disease Mother   . Cancer Father   . Pancreatic cancer     Social History  Substance Use Topics  . Smoking status: Current Every Day Smoker    Types: Pipe  . Smokeless tobacco: Never Used     Comment: smoking 1/2 ppd (04/20/13) - smokes 1 pipe per day x48yr 12/18/14  . Alcohol Use: No   OB History    No data available     Review of Systems  Respiratory: Negative for shortness of breath.   Cardiovascular: Negative for chest pain.  Gastrointestinal: Negative for abdominal pain.  All other systems reviewed and are negative.     Allergies  Albuterol  sulfate  Home Medications   Prior to Admission medications   Medication Sig Start Date End Date Taking? Authorizing Provider  Fluticasone Furoate-Vilanterol (BREO ELLIPTA) 100-25 MCG/INH AEPB Inhale 1 puff into the lungs daily. 03/17/15  Yes MBrand Males MD  ibuprofen (ADVIL,MOTRIN) 200 MG tablet Take 200 mg by mouth every 6 (six) hours as needed for headache, mild pain or moderate pain.   Yes Historical Provider, MD  levalbuterol (XOPENEX HFA) 45 MCG/ACT inhaler INHALE 1-2 PUFFS INTO THE LUNGS EVERY 4 HRS AS NEEDED FOR WHEEZING Patient taking differently: Inhale 2 puffs into the lungs every 4 (four) hours as needed for wheezing or shortness of breath.  07/02/14  Yes MBrand Males MD  SYMBICORT 80-4.5 MCG/ACT inhaler Inhale 2 puffs into the lungs 2 (two) times daily. 07/20/15  Yes Historical Provider, MD  Tiotropium Bromide Monohydrate (SPIRIVA RESPIMAT) 2.5 MCG/ACT AERS Inhale 2 puffs into the lungs daily. 03/17/15 07/21/15 Yes MBrand Males MD   BP 131/67 mmHg  Pulse 67  Temp(Src) 98.1 F (36.7 C) (Oral)  Resp 16  SpO2 99% Physical Exam  Constitutional: She is oriented to person, place, and time. She appears well-developed and well-nourished. No distress.  HENT:  Head: Normocephalic.  Eyes: Conjunctivae are normal.  Neck: Neck supple. No tracheal deviation present.  Cardiovascular: Normal rate and regular rhythm.   Pulmonary/Chest:  Effort normal. No respiratory distress.  Abdominal: Soft. She exhibits no distension.  Musculoskeletal:       Left shoulder: She exhibits tenderness and pain. She exhibits normal range of motion and no deformity.  Neurological: She is alert and oriented to person, place, and time.  Skin: Skin is warm and dry.  Psychiatric: She has a normal mood and affect.    ED Course  Procedures (including critical care time) Labs Review Labs Reviewed - No data to display  Imaging Review Ct Shoulder Left Wo Contrast  07/21/2015   CLINICAL DATA:  RIGHT  shoulder fracture  EXAM: CT OF THE LEFT SHOULDER WITHOUT CONTRAST  TECHNIQUE: Multidetector CT imaging was performed according to the standard protocol. Multiplanar CT image reconstructions were also generated.  COMPARISON:  Radiograph 07/21/2015  FINDINGS: There is impaction fracture of the LEFT at the surgical neck with mild comminution. There is tilting of the humeral head posteriorly as seen in the sagittal projection. The glenohumeral joint is intact however the humeral head rides slightly high in the joint. No significant hematoma within the joint. The scapular fracture.  IMPRESSION: Impaction fracture of the LEFT humeral head.  No dislocation   Electronically Signed   By: Suzy Bouchard M.D.   On: 07/21/2015 17:55   Dg Shoulder Left  07/21/2015   CLINICAL DATA:  69 year old female with history of trauma from fall landing onto her left side complaining of left shoulder pain.  EXAM: LEFT SHOULDER - 2+ VIEW  COMPARISON:  No priors.  FINDINGS: There appears to be subtle disruption of normal trabecular markings in the right humeral neck, suggestive of a very subtle nondisplaced but likely comminuted humeral neck fracture. Additionally, on the Y-view there appears to be a cortical offset in this region. Humeral head is located. Humeral head is high riding, suggesting underlying rotator cuff pathology. Degenerative changes of osteoarthritis are noted in the glenohumeral and acromioclavicular joints.  IMPRESSION: 1. Subtle findings concerning for nondisplaced but potentially comminuted humeral neck fracture. Further evaluation with noncontrast CT of the left shoulder is recommended at this time.   Electronically Signed   By: Vinnie Langton M.D.   On: 07/21/2015 16:00   I have personally reviewed and evaluated these images and lab results as part of my medical decision-making.   EKG Interpretation None      MDM   Final diagnoses:  Humerus fracture, left, closed, initial encounter    69 y.o. female  presents after fall from standing after tripping in a hole in her yard. CT confirmed proximal humerus fracture. Provided analgesia, NWB LUE, placed in sling for comfort. Outpatient ortho follow up.     Leo Grosser, MD 07/22/15 [phone]

## 2015-07-21 NOTE — ED Notes (Addendum)
Patient was alert, oriented and stable upon discharge. RN went over AVS and patient had no further questions. Pt ambulatory and acknowledged not to drive while taking hydrocodone.

## 2015-07-21 NOTE — ED Notes (Signed)
Pt comes in today with a c/o left arm/shoulder pain. Pt states that she fell around 1100 today.

## 2015-07-22 ENCOUNTER — Ambulatory Visit: Payer: Medicare Other | Admitting: Internal Medicine

## 2015-07-23 ENCOUNTER — Telehealth: Payer: Self-pay | Admitting: Internal Medicine

## 2015-07-23 NOTE — Telephone Encounter (Signed)
Prefer pcp orders this but No PCP Per Patient . Please find out who pcp is? If no pcp ok to order

## 2015-07-23 NOTE — Telephone Encounter (Signed)
Spoke with pt.  She would like to know if MR will place order for her to obtain a walker without wheels through [place].  MR, please advise if you are ok with ordering this for pt.  Thank you.

## 2015-07-24 ENCOUNTER — Ambulatory Visit (HOSPITAL_COMMUNITY): Payer: Medicare Other

## 2015-07-24 NOTE — Telephone Encounter (Signed)
Pt's pcp is dr Maryruth Eve at [facility].  I verified this with patient and insurance company. I will add this to her chart. She will call back if needed.

## 2015-07-24 NOTE — Telephone Encounter (Signed)
Called pt. Line rang numerous times, NA and no VM WCB

## 2015-07-31 ENCOUNTER — Ambulatory Visit: Payer: Medicare Other | Admitting: Internal Medicine

## 2015-08-12 ENCOUNTER — Ambulatory Visit: Payer: Medicare Other | Admitting: Internal Medicine

## 2015-08-16 ENCOUNTER — Other Ambulatory Visit: Payer: Self-pay | Admitting: Internal Medicine

## 2015-08-19 ENCOUNTER — Ambulatory Visit (INDEPENDENT_AMBULATORY_CARE_PROVIDER_SITE_OTHER): Payer: Medicare Other | Admitting: Internal Medicine

## 2015-08-19 ENCOUNTER — Encounter: Payer: Self-pay | Admitting: Internal Medicine

## 2015-08-19 VITALS — BP 122/60 | HR 74 | Ht 68.0 in | Wt 131.0 lb

## 2015-08-19 DIAGNOSIS — J449 Chronic obstructive pulmonary disease, unspecified: Secondary | ICD-10-CM | POA: Diagnosis not present

## 2015-08-19 DIAGNOSIS — Z23 Encounter for immunization: Secondary | ICD-10-CM | POA: Diagnosis not present

## 2015-08-19 NOTE — Progress Notes (Signed)
Subjective:     Patient ID: Karen Coffey, female   DOB: December 19, 1945, 69 y.o.   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 Carboplatin\Etoposide; Started 11/28/2011   2. PCI completed 06/09/12    - in CR as of MAy 2014 per hx and CT chest oct 2015 at [facility]  #COPD - GOld stage  2...........Marland KitchenChase Caller since may 2014   - Jan 2013: FEv 1.3L/47%, RAtio 47 -> not on RX becauise she did not fu with [facility]. - PFT on 04/20/13 showed FEV1 at 1.21 L (44%) , ratio 43 , + change with BD (31%)    #Smoker   reports that she has been smoking Pipe.  She has never used smokeless tobacco.   #AECOP{D Feb 2016 - tepelphone zpak and pred March 2016 - doxy and pred over telephone      OV 02/25/2015  Chief Complaint  Patient presents with  . Follow-up    Pt here to discuss COPD research. Pt c/o DOE, prod cough with clear mucus. Pt denies CP/tightness.       COPD: . Continues spiriva and symbicort. Some good and bad days. However since the onset 2016 she's had 2 exacerbations for which treatment was called in with antibiotic and prednisone over the telephone once and every 2016 and second one in March 2016. With these help significantly. She now again feels that she is in another exacerbation with worsening cough, wheezing, chest tightness and mild increase in sputum volumes for the last few to several days. She insists that she is compliant with the Spiriva and Symbicort. However she is now smoking a pipe.   SMoking:  reports that she has been smoking Pipe.  She has never used smokeless tobacco. SHe has quit conventional cigarettes. She loves her pipe and wont quit  Cancer: reviewd [facility] records. CT chest Oct 2015 and clinic visit oct 2015 - cancer under remissioin. ECOG 1 year follow-up CT scan of the chest due sometime now   04/18/2015 Follow up : COPD , SMall  cell Lung cancer - Nov 17, 2011 Pt walked in office today for follow up visit.  Pt changed to BREO and Spiriva last ov  Continues to smoke, cessation discussed.  Does get winded with some activity.  No chest pain, orthopnea, edema or hemoptysis.   Serial CT chest done 01/2015 (reviewed from care everywhere) .  Showed no evidence of disease reoccurence. , advance emphysema .   OV 08/19/2015  Chief Complaint  Patient presents with  . Follow-up    Pt states her breathing is at baseline. Pt fell in early Sept and had a left humerus fracture. Pt c/o non prod cough. Pt denies CP/tightness.     Follow-up Gold stage II/3 to COPD with bronchial dilator response. Continued smoking. Since last seeing me nurse practitioner in June 2016 she continues to do well. Overall no problems. In the interim she fell down and broke her left humerus. Otherwise COPD stable. She continues to smoke finds it difficult to quit. She is compliant with her Spiriva and Brio. She feels Brio works better than Symbicort. She has not had a flu shot. She says the flu shot hurts but she and a sense the need to get it and will have one today   Immunization History  Administered Date(s) Administered  . Influenza  Split 10/04/2011, 09/15/2012, 07/21/2014  . Pneumococcal Conjugate-13 12/18/2014       Current outpatient prescriptions:  .  Fluticasone Furoate-Vilanterol (BREO ELLIPTA) 100-25 MCG/INH AEPB, Inhale 1 puff into the lungs daily., Disp: 1 each, Rfl: 3 .  HYDROcodone-acetaminophen (NORCO/VICODIN) 5-325 MG per tablet, Take 1 tablet by mouth every 6 (six) hours as needed for severe pain., Disp: 12 tablet, Rfl: 0 .  ibuprofen (ADVIL,MOTRIN) 200 MG tablet, Take 200 mg by mouth every 6 (six) hours as needed for headache, mild pain or moderate pain., Disp: , Rfl:  .  XOPENEX HFA 45 MCG/ACT inhaler, INHALE 1-2 PUFFS INTO THE LUNGS EVERY 4 HRS AS NEEDED FOR WHEEZING, Disp: 15 g, Rfl: 5 .  Tiotropium Bromide Monohydrate (SPIRIVA  RESPIMAT) 2.5 MCG/ACT AERS, Inhale 2 puffs into the lungs daily., Disp: 4 g, Rfl: 6   Review of Systems    positive for shortness of breath and fatigue. Otherwise 11 point review of system is negative Objective:   Physical Exam  Constitutional: She is oriented to person, place, and time. She appears well-developed and well-nourished. No distress.  Body mass index is 19.92 kg/(m^2).   HENT:  Head: Normocephalic and atraumatic.  Right Ear: External ear normal.  Left Ear: External ear normal.  Mouth/Throat: Oropharynx is clear and moist. No oropharyngeal exudate.  ? Wig   Eyes: Conjunctivae and EOM are normal. Pupils are equal, round, and reactive to light. Right eye exhibits no discharge. Left eye exhibits no discharge. No scleral icterus.  Neck: Normal range of motion. Neck supple. No JVD present. No tracheal deviation present. No thyromegaly present.  Cardiovascular: Normal rate, regular rhythm, normal heart sounds and intact distal pulses.  Exam reveals no gallop and no friction rub.   No murmur heard. Pulmonary/Chest: Effort normal and breath sounds normal. No respiratory distress. She has no wheezes. She has no rales. She exhibits no tenderness.  Abdominal: Soft. Bowel sounds are normal. She exhibits no distension and no mass. There is no tenderness. There is no rebound and no guarding.  Musculoskeletal: Normal range of motion. She exhibits no edema or tenderness.  Lymphadenopathy:    She has no cervical adenopathy.  Neurological: She is alert and oriented to person, place, and time. She has normal reflexes. No cranial nerve deficit. She exhibits normal muscle tone. Coordination normal.  Skin: Skin is warm and dry. No rash noted. She is not diaphoretic. No erythema. No pallor.  Psychiatric: She has a normal mood and affect. Her behavior is normal. Judgment and thought content normal.  Vitals reviewed.   Filed Vitals:   08/19/15 1655  BP: 122/60  Pulse: 74  Height: '5\' 8"'$  (1.727  m)  Weight: 131 lb (59.421 kg)  SpO2: 98%        Assessment:       ICD-9-CM ICD-10-CM   1. COPD, moderate ([place]) 496 J44.9        Plan:         COPD  -copd stable - Please continue spiriva 1 puff daily  - Please continue  breo daily - use xopenex as needed - flu shot 08/19/2015    #smoking  - please work on quitting smoking tobacco asap   #Followup  -6 months or sooner if needed   Dr. Brand Males, M.D., [facility].C.P Pulmonary and Critical Care Medicine Staff Physician [place] [facility] Pager: [phone], If no answer or between  15:00h - 7:00h: call [phone]  [phone]  08/19/2015 5:31 PM

## 2015-08-19 NOTE — Patient Instructions (Addendum)
#  COPD  -copd stable - Please continue spiriva 1 puff daily  - Please continue  breo daily - use xopenex as needed - flu shot 08/19/2015    #smoking  - please work on quitting smoking tobacco asap   #Followup  -6 months or sooner if needed

## 2015-09-22 ENCOUNTER — Telehealth: Payer: Self-pay | Admitting: Internal Medicine

## 2015-09-22 MED ORDER — DOXYCYCLINE HYCLATE 100 MG PO TABS: 100.0000 mg | ORAL_TABLET | Freq: Two times a day (BID) | ORAL | Status: DC

## 2015-09-22 MED ORDER — PREDNISONE 10 MG PO TABS: ORAL_TABLET | ORAL | Status: DC

## 2015-09-22 NOTE — Telephone Encounter (Signed)
Called spoke with pt. She c/o slight prod cough (clear phlem), wheezing.chest tx in mornings, no increase SOB, no f/c/s/n/v. She is wanting something called in. Please advise MR thanks

## 2015-09-22 NOTE — Telephone Encounter (Signed)
Spoke with pt. She is aware of MR's recommendations. Rx's have been sent in. Nothing further was needed.

## 2015-09-22 NOTE — Telephone Encounter (Signed)
  You have  attack of copd called COPD exacerbation Please take doxycycline '100mg'$  twice daily after meals x 5 days; avoid sunlight Please take prednisone 40 mg x1 day, then 30 mg x1 day, then 20 mg x1 day, then 10 mg x1 day, and then 5 mg x1 day and stop    Current outpatient prescriptions:  .  Fluticasone Furoate-Vilanterol (BREO ELLIPTA) 100-25 MCG/INH AEPB, Inhale 1 puff into the lungs daily., Disp: 1 each, Rfl: 3 .  HYDROcodone-acetaminophen (NORCO/VICODIN) 5-325 MG per tablet, Take 1 tablet by mouth every 6 (six) hours as needed for severe pain., Disp: 12 tablet, Rfl: 0 .  ibuprofen (ADVIL,MOTRIN) 200 MG tablet, Take 200 mg by mouth every 6 (six) hours as needed for headache, mild pain or moderate pain., Disp: , Rfl:  .  Tiotropium Bromide Monohydrate (SPIRIVA RESPIMAT) 2.5 MCG/ACT AERS, Inhale 2 puffs into the lungs daily., Disp: 4 g, Rfl: 6 .  XOPENEX HFA 45 MCG/ACT inhaler, INHALE 1-2 PUFFS INTO THE LUNGS EVERY 4 HRS AS NEEDED FOR WHEEZING, Disp: 15 g, Rfl: 5  Allergies  Allergen Reactions  . Albuterol Sulfate     tachycardia

## 2015-10-13 ENCOUNTER — Telehealth: Payer: Self-pay | Admitting: Internal Medicine

## 2015-10-13 ENCOUNTER — Other Ambulatory Visit: Payer: Self-pay | Admitting: Internal Medicine

## 2015-10-13 NOTE — Telephone Encounter (Signed)
Called and spoke to pt. Informed her of the recs per RB. Appt made with MW on 10/14/15. Pt verbalized understanding and denied any further questions or concerns at this time.

## 2015-10-13 NOTE — Telephone Encounter (Signed)
[phone], pt cb to f/u

## 2015-10-13 NOTE — Telephone Encounter (Signed)
She needs an office visit to be seen by either MR or TP. She was just treated with prednisone taper for apparent exacerbation and now has recurrent symptoms. Unclear to me whether another round of prednisone is indicated. If she changes acutely or worsens that she needs to go to urgent care

## 2015-10-13 NOTE — Telephone Encounter (Signed)
Spoke with pt. She is asking for prednisone to be called in. Pt reports she is coughing up lots of clear phlem, chest tx/wheezing this AM. Pt is requesting to have prednisone called in. MR NA this afternoon. Will forward to DOD Dr. Lamonte Sakai, Please advise thanks

## 2015-10-14 ENCOUNTER — Ambulatory Visit: Payer: Medicare Other | Admitting: Internal Medicine

## 2015-11-03 ENCOUNTER — Telehealth: Payer: Self-pay | Admitting: Internal Medicine

## 2015-11-03 MED ORDER — TIOTROPIUM BROMIDE MONOHYDRATE 2.5 MCG/ACT IN AERS: 2.0000 | INHALATION_SPRAY | Freq: Every day | RESPIRATORY_TRACT | Status: DC

## 2015-11-03 NOTE — Telephone Encounter (Signed)
Called and spoke with pt Pt requesting refills on her Spiriva Pt informed that refill would be sent  Refill sent electronically to pharmacy Nothing further is needed

## 2015-11-27 ENCOUNTER — Telehealth: Payer: Self-pay | Admitting: Internal Medicine

## 2015-11-27 MED ORDER — LEVALBUTEROL TARTRATE 45 MCG/ACT IN AERO: INHALATION_SPRAY | RESPIRATORY_TRACT | Status: DC

## 2015-11-27 NOTE — Telephone Encounter (Signed)
Called spoke with pt. She needs refill on xopenex inhaler. I have refilled this. Nothing further needed

## 2015-11-27 NOTE — Telephone Encounter (Signed)
lmtcb x1 for pt. 

## 2015-11-28 NOTE — Telephone Encounter (Signed)
atc pt X2, line busy.  Wcb.  

## 2015-11-28 NOTE — Telephone Encounter (Signed)
Attempted to call pt. Line was busy. Will try back. 

## 2015-11-28 NOTE — Telephone Encounter (Signed)
Patient returned call,CB is [phone]

## 2015-12-01 NOTE — Telephone Encounter (Signed)
Pt concerned with Rx that she picked up. States that she picked up Xopenex from the pharmacy (Generic - Levalbuterol) and is concerned with the generic name having Albuterol in it - a medication on her allergy list causing 'tachycardia' - Advised the patient that what she picked up is Xopenex 33mg - pt confirmed dosing and instructions on the inhaler box to match what we sent in. I informed her that this is safe for her to take. Call if any increased heart rate. Nothing further needed.

## 2015-12-02 ENCOUNTER — Telehealth: Payer: Self-pay | Admitting: Internal Medicine

## 2015-12-02 MED ORDER — DOXYCYCLINE HYCLATE 100 MG PO TABS: ORAL_TABLET | ORAL | Status: DC

## 2015-12-02 MED ORDER — PREDNISONE 10 MG PO TABS: ORAL_TABLET | ORAL | Status: DC

## 2015-12-02 NOTE — Telephone Encounter (Signed)
Pt c/o bad cough and increased SOB x 3-4 days. Using Xopenex inhaler prn - states that this helps a lot but does not last.  Pt is requesting Rx for Prednisone. Please advise Dr Chase Caller.

## 2015-12-02 NOTE — Telephone Encounter (Signed)
Spoke with patient, aware of rec's per MR.  Rxs for Doxy and Pred sent to her pharmacy.  Nothing further needed.

## 2015-12-02 NOTE — Telephone Encounter (Signed)
Likely aecopd  Take doxycycline '100mg'$  po twice daily x 5 days; take after meals and avoid sunlight  Please take prednisone 40 mg x1 day, then 30 mg x1 day, then 20 mg x1 day, then 10 mg x1 day, and then 5 mg x1 day and stop

## 2016-01-14 ENCOUNTER — Ambulatory Visit: Payer: Medicare Other | Admitting: Internal Medicine

## 2016-01-19 ENCOUNTER — Ambulatory Visit: Payer: Medicare Other | Admitting: Internal Medicine

## 2016-01-27 ENCOUNTER — Other Ambulatory Visit: Payer: Self-pay | Admitting: Internal Medicine

## 2016-01-28 ENCOUNTER — Telehealth: Payer: Self-pay | Admitting: Internal Medicine

## 2016-01-28 MED ORDER — TIOTROPIUM BROMIDE MONOHYDRATE 2.5 MCG/ACT IN AERS: 2.0000 | INHALATION_SPRAY | Freq: Every day | RESPIRATORY_TRACT | Status: DC

## 2016-01-28 MED ORDER — FLUTICASONE FUROATE-VILANTEROL 100-25 MCG/INH IN AEPB: 1.0000 | INHALATION_SPRAY | Freq: Every day | RESPIRATORY_TRACT | Status: DC

## 2016-01-28 NOTE — Telephone Encounter (Signed)
Called and spoke with the pt. She wanted to verify that levalbuterol was xopenex. I explained to her that this in fact was the same medication. She stated that she needs refills on her Breo and Spiriva Respimat as well. [facility]ified [facility] [address]. She voiced understanding and had no further questions. Both prescriptions have been sent. Nothing further needed.

## 2016-03-27 ENCOUNTER — Telehealth: Payer: Self-pay | Admitting: Internal Medicine

## 2016-03-27 NOTE — Telephone Encounter (Signed)
Symptoms c/w AECOPD - more coughh. More sputum , same color, no change in wheezing. No change in dyspnea. X 2 days. Trigger: pollen per patient. Ass sneezing  Plan Please take prednisone 40 mg x1 day, then 30 mg x1 day, then 20 mg x1 day, then 10 mg x1 day, and then 5 mg x1 day and stop cotninue other meds  [facility] [address]    Dr. Brand Males, M.D., [facility].C.P Pulmonary and Critical Care Medicine Staff Physician [place] [facility] and Critical [facility] Pager: [phone], If no answer or between  15:00h - 7:00h: call [phone]  03/27/2016 12:45 PM  '

## 2016-04-21 ENCOUNTER — Telehealth: Payer: Self-pay | Admitting: Internal Medicine

## 2016-04-21 NOTE — Telephone Encounter (Signed)
Give her fu appt to see an APP

## 2016-04-21 NOTE — Telephone Encounter (Signed)
Noted. Will route message to MR to make him aware.

## 2016-04-21 NOTE — Telephone Encounter (Signed)
Attempted to contact pt. No answer, no option to leave a message. Will try back.  

## 2016-04-22 ENCOUNTER — Telehealth: Payer: Self-pay | Admitting: Internal Medicine

## 2016-04-22 MED ORDER — PREDNISONE 10 MG PO TABS: ORAL_TABLET | ORAL | Status: DC

## 2016-04-22 MED ORDER — AZITHROMYCIN 250 MG PO TABS: ORAL_TABLET | ORAL | Status: AC

## 2016-04-22 NOTE — Telephone Encounter (Signed)
Patient calling wanting refill of cough medicine -prm

## 2016-04-22 NOTE — Telephone Encounter (Signed)
Spoke with Karen Coffey, c/o nonprod cough, worsening sob X3-4 days.  Denies mucus production, chest pain/tightness. Karen Coffey requesting something to help with s/s.   [facility] [address].   Karen Coffey states that usually MR gives her a pred taper and an abx to help with s/s.    Sending to DOD since MR is off this afternoon.  CY please advise on recs.  Thanks!   Allergies  Allergen Reactions  . Albuterol Sulfate     tachycardia   Current Outpatient Prescriptions on File Prior to Visit  Medication Sig Dispense Refill  . BREO ELLIPTA 100-25 MCG/INH AEPB INHALE 1 PUFF INTO THE LUNGS DAILY. INS WILL PAY ON 03-24-15 60 each 3  . doxycycline (VIBRA-TABS) 100 MG tablet Take 1 tablet (100 mg total) by mouth 2 (two) times daily. 10 tablet 0  . doxycycline (VIBRA-TABS) 100 MG tablet Take 1 po BID x 5 days; take after meals and avoid sunlight 10 tablet 0  . fluticasone furoate-vilanterol (BREO ELLIPTA) 100-25 MCG/INH AEPB Inhale 1 puff into the lungs daily. 1 each 3  . HYDROcodone-acetaminophen (NORCO/VICODIN) 5-325 MG per tablet Take 1 tablet by mouth every 6 (six) hours as needed for severe pain. 12 tablet 0  . ibuprofen (ADVIL,MOTRIN) 200 MG tablet Take 200 mg by mouth every 6 (six) hours as needed for headache, mild pain or moderate pain.    Marland Kitchen levalbuterol (XOPENEX HFA) 45 MCG/ACT inhaler INHALE 1-2 PUFFS INTO THE LUNGS EVERY 4 HRS AS NEEDED FOR WHEEZING 15 g 5  . predniSONE (DELTASONE) 10 MG tablet Take 4 tablets for 1 day, 3 tablets for 1 day, 2 tablets for 1 day, 1 tablet for 1 day, 0.5 tablet for 1 day then stop 11 tablet 0  . predniSONE (DELTASONE) 10 MG tablet Take 40 mg x 1 day, then 30 mg x 1 day, then 20 mg x 1 day, then 10 mg x 1 day, and then 5 mg x 1 day and stop 11 tablet 0  . Tiotropium Bromide Monohydrate (SPIRIVA RESPIMAT) 2.5 MCG/ACT AERS Inhale 2 puffs into the lungs daily. 4 g 5   No current facility-administered medications on file prior to visit.

## 2016-04-22 NOTE — Telephone Encounter (Signed)
Pt is aware of CY's recommendations. Rxs have been sent in. Nothing further was needed.

## 2016-04-22 NOTE — Telephone Encounter (Signed)
Ok to offer Z pak and prednsione 10 mg, # 20, 4 X 2 DAYS, 3 X 2 DAYS, 2 X 2 DAYS, 1 X 2 DAYS

## 2016-04-23 NOTE — Telephone Encounter (Signed)
ATC pt to schedule HFU with NP.  NA and no option to leave msg.  WCB.

## 2016-04-26 NOTE — Telephone Encounter (Signed)
Attempted to contact pt. No answer, no option to leave a message. Will try back.  

## 2016-04-27 NOTE — Telephone Encounter (Signed)
ATC but line rang until fast busy signal came on.  Called Phoebe @ [facility]. LM that we have been unable to reach pt.   Closing encounter per triage protocol.

## 2016-05-15 DIAGNOSIS — S72142A Displaced intertrochanteric fracture of left femur, initial encounter for closed fracture: Secondary | ICD-10-CM

## 2016-05-15 DIAGNOSIS — D72829 Elevated white blood cell count, unspecified: Secondary | ICD-10-CM

## 2016-05-15 HISTORY — DX: Displaced intertrochanteric fracture of left femur, initial encounter for closed fracture: S72.142A

## 2016-05-15 HISTORY — DX: Elevated white blood cell count, unspecified: D72.829

## 2016-05-24 ENCOUNTER — Other Ambulatory Visit: Payer: Self-pay | Admitting: Internal Medicine

## 2016-06-14 ENCOUNTER — Telehealth: Payer: Self-pay | Admitting: Internal Medicine

## 2016-06-14 ENCOUNTER — Encounter (HOSPITAL_COMMUNITY): Payer: Self-pay | Admitting: Emergency Medicine

## 2016-06-14 ENCOUNTER — Emergency Department (HOSPITAL_COMMUNITY): Payer: Medicare Other

## 2016-06-14 ENCOUNTER — Inpatient Hospital Stay (HOSPITAL_COMMUNITY)
Admission: EM | Admit: 2016-06-14 | Discharge: 2016-06-19 | DRG: 481 | Disposition: A | Discharge status: Skilled Nursing Facility | Payer: Medicare Other | Attending: Internal Medicine | Admitting: Internal Medicine

## 2016-06-14 DIAGNOSIS — D62 Acute posthemorrhagic anemia: Secondary | ICD-10-CM | POA: Diagnosis not present

## 2016-06-14 DIAGNOSIS — Z792 Long term (current) use of antibiotics: Secondary | ICD-10-CM

## 2016-06-14 DIAGNOSIS — Z419 Encounter for procedure for purposes other than remedying health state, unspecified: Secondary | ICD-10-CM

## 2016-06-14 DIAGNOSIS — S72142A Displaced intertrochanteric fracture of left femur, initial encounter for closed fracture: Principal | ICD-10-CM | POA: Diagnosis present

## 2016-06-14 DIAGNOSIS — J441 Chronic obstructive pulmonary disease with (acute) exacerbation: Secondary | ICD-10-CM | POA: Diagnosis present

## 2016-06-14 DIAGNOSIS — N390 Urinary tract infection, site not specified: Secondary | ICD-10-CM | POA: Diagnosis present

## 2016-06-14 DIAGNOSIS — Z7982 Long term (current) use of aspirin: Secondary | ICD-10-CM

## 2016-06-14 DIAGNOSIS — D649 Anemia, unspecified: Secondary | ICD-10-CM | POA: Diagnosis not present

## 2016-06-14 DIAGNOSIS — J449 Chronic obstructive pulmonary disease, unspecified: Secondary | ICD-10-CM

## 2016-06-14 DIAGNOSIS — S72002D Fracture of unspecified part of neck of left femur, subsequent encounter for closed fracture with routine healing: Secondary | ICD-10-CM | POA: Diagnosis not present

## 2016-06-14 DIAGNOSIS — W010XXA Fall on same level from slipping, tripping and stumbling without subsequent striking against object, initial encounter: Secondary | ICD-10-CM | POA: Diagnosis present

## 2016-06-14 DIAGNOSIS — S72009A Fracture of unspecified part of neck of unspecified femur, initial encounter for closed fracture: Secondary | ICD-10-CM | POA: Diagnosis present

## 2016-06-14 DIAGNOSIS — Z85118 Personal history of other malignant neoplasm of bronchus and lung: Secondary | ICD-10-CM

## 2016-06-14 DIAGNOSIS — Z8249 Family history of ischemic heart disease and other diseases of the circulatory system: Secondary | ICD-10-CM

## 2016-06-14 DIAGNOSIS — S72002A Fracture of unspecified part of neck of left femur, initial encounter for closed fracture: Secondary | ICD-10-CM | POA: Diagnosis present

## 2016-06-14 DIAGNOSIS — Z800 Family history of malignant neoplasm of digestive organs: Secondary | ICD-10-CM | POA: Diagnosis not present

## 2016-06-14 DIAGNOSIS — D72829 Elevated white blood cell count, unspecified: Secondary | ICD-10-CM | POA: Diagnosis present

## 2016-06-14 DIAGNOSIS — F17290 Nicotine dependence, other tobacco product, uncomplicated: Secondary | ICD-10-CM | POA: Diagnosis present

## 2016-06-14 DIAGNOSIS — R0902 Hypoxemia: Secondary | ICD-10-CM

## 2016-06-14 DIAGNOSIS — M25552 Pain in left hip: Secondary | ICD-10-CM | POA: Diagnosis present

## 2016-06-14 LAB — BASIC METABOLIC PANEL
Anion gap: 6 (ref 5–15)
BUN: 9 mg/dL (ref 6–20)
CO2: 25 mmol/L (ref 22–32)
Calcium: 8.9 mg/dL (ref 8.9–10.3)
Chloride: 104 mmol/L (ref 101–111)
Creatinine, Ser: 0.86 mg/dL (ref 0.44–1.00)
Glucose, Bld: 106 mg/dL — ABNORMAL HIGH (ref 65–99)
POTASSIUM: 4.1 mmol/L (ref 3.5–5.1)
SODIUM: 135 mmol/L (ref 135–145)

## 2016-06-14 LAB — URINALYSIS, ROUTINE W REFLEX MICROSCOPIC
BILIRUBIN URINE: NEGATIVE
GLUCOSE, UA: NEGATIVE mg/dL
KETONES UR: NEGATIVE mg/dL
Nitrite: POSITIVE — AB
PH: 5.5 (ref 5.0–8.0)
Protein, ur: NEGATIVE mg/dL
Specific Gravity, Urine: 1.013 (ref 1.005–1.030)

## 2016-06-14 LAB — TYPE AND SCREEN
ABO/RH(D): O POS
ANTIBODY SCREEN: NEGATIVE

## 2016-06-14 LAB — CBC WITH DIFFERENTIAL/PLATELET
BASOS ABS: 0 10*3/uL (ref 0.0–0.1)
Basophils Relative: 0 %
EOS ABS: 0.1 10*3/uL (ref 0.0–0.7)
EOS PCT: 1 %
HCT: 34.6 % — ABNORMAL LOW (ref 36.0–46.0)
HEMOGLOBIN: 11.6 g/dL — AB (ref 12.0–15.0)
LYMPHS PCT: 10 %
Lymphs Abs: 1.1 10*3/uL (ref 0.7–4.0)
MCH: 30.1 pg (ref 26.0–34.0)
MCHC: 33.5 g/dL (ref 30.0–36.0)
MCV: 89.6 fL (ref 78.0–100.0)
Monocytes Absolute: 0.5 10*3/uL (ref 0.1–1.0)
Monocytes Relative: 4 %
NEUTROS PCT: 85 %
Neutro Abs: 9.7 10*3/uL — ABNORMAL HIGH (ref 1.7–7.7)
PLATELETS: 221 10*3/uL (ref 150–400)
RBC: 3.86 MIL/uL — AB (ref 3.87–5.11)
RDW: 13.5 % (ref 11.5–15.5)
WBC: 11.5 10*3/uL — AB (ref 4.0–10.5)

## 2016-06-14 LAB — ABO/RH: ABO/RH(D): O POS

## 2016-06-14 LAB — URINE MICROSCOPIC-ADD ON

## 2016-06-14 LAB — IRON AND TIBC
IRON: 26 ug/dL — AB (ref 28–170)
Saturation Ratios: 8 % — ABNORMAL LOW (ref 10.4–31.8)
TIBC: 325 ug/dL (ref 250–450)
UIBC: 299 ug/dL

## 2016-06-14 LAB — FERRITIN: Ferritin: 54 ng/mL (ref 11–307)

## 2016-06-14 LAB — PROTIME-INR
INR: 1.06
PROTHROMBIN TIME: 13.8 s (ref 11.4–15.2)

## 2016-06-14 LAB — VITAMIN B12: VITAMIN B 12: 711 pg/mL (ref 180–914)

## 2016-06-14 MED ORDER — MORPHINE SULFATE (PF) 2 MG/ML IV SOLN
0.5000 mg | INTRAVENOUS | Status: DC | PRN
  Administered 2016-06-14 – 2016-06-17 (×2): 0.5 mg via INTRAVENOUS
  Filled 2016-06-14 (×2): qty 1

## 2016-06-14 MED ORDER — LEVALBUTEROL HCL 1.25 MG/0.5ML IN NEBU
1.2500 mg | INHALATION_SOLUTION | RESPIRATORY_TRACT | Status: DC | PRN
  Filled 2016-06-14: qty 0.5

## 2016-06-14 MED ORDER — ATORVASTATIN CALCIUM 10 MG PO TABS
10.0000 mg | ORAL_TABLET | Freq: Every day | ORAL | Status: DC
  Administered 2016-06-15 – 2016-06-18 (×4): 10 mg via ORAL
  Filled 2016-06-14 (×4): qty 1

## 2016-06-14 MED ORDER — IPRATROPIUM BROMIDE 0.02 % IN SOLN
RESPIRATORY_TRACT | Status: AC
  Administered 2016-06-14: 0.5 mg via RESPIRATORY_TRACT
  Filled 2016-06-14: qty 2.5

## 2016-06-14 MED ORDER — HEPARIN SODIUM (PORCINE) 5000 UNIT/ML IJ SOLN: 5000.0000 [IU] | Freq: Once | INTRAMUSCULAR | Status: DC

## 2016-06-14 MED ORDER — TIOTROPIUM BROMIDE MONOHYDRATE 18 MCG IN CAPS
1.0000 | ORAL_CAPSULE | Freq: Every day | RESPIRATORY_TRACT | Status: DC
  Administered 2016-06-16 – 2016-06-17 (×2): 18 ug via RESPIRATORY_TRACT
  Filled 2016-06-14: qty 5

## 2016-06-14 MED ORDER — PREDNISONE 10 MG PO TABS: ORAL_TABLET | ORAL | 0 refills | Status: DC

## 2016-06-14 MED ORDER — POLYETHYLENE GLYCOL 3350 17 G PO PACK
17.0000 g | PACK | Freq: Every day | ORAL | Status: DC | PRN
Start: 2016-06-14 — End: 2016-06-19

## 2016-06-14 MED ORDER — IPRATROPIUM BROMIDE 0.02 % IN SOLN
0.5000 mg | Freq: Four times a day (QID) | RESPIRATORY_TRACT | Status: DC
  Administered 2016-06-14: 0.5 mg via RESPIRATORY_TRACT

## 2016-06-14 MED ORDER — SODIUM CHLORIDE 0.9 % IV SOLN
INTRAVENOUS | Status: DC
  Administered 2016-06-14: 23:00:00 via INTRAVENOUS

## 2016-06-14 MED ORDER — IPRATROPIUM BROMIDE 0.02 % IN SOLN
0.5000 mg | RESPIRATORY_TRACT | Status: DC | PRN
Start: 2016-06-14 — End: 2016-06-19
  Administered 2016-06-18: 0.5 mg via RESPIRATORY_TRACT
  Filled 2016-06-14 (×4): qty 2.5

## 2016-06-14 MED ORDER — SENNA 8.6 MG PO TABS
1.0000 | ORAL_TABLET | Freq: Two times a day (BID) | ORAL | Status: DC
  Administered 2016-06-14 – 2016-06-19 (×10): 8.6 mg via ORAL
  Filled 2016-06-14 (×10): qty 1

## 2016-06-14 MED ORDER — LEVALBUTEROL HCL 1.25 MG/0.5ML IN NEBU
INHALATION_SOLUTION | RESPIRATORY_TRACT | Status: AC
  Administered 2016-06-14: 1.25 mg via RESPIRATORY_TRACT
  Filled 2016-06-14: qty 0.5

## 2016-06-14 MED ORDER — VITAMIN B-12 1000 MCG PO TABS
1000.0000 ug | ORAL_TABLET | Freq: Every day | ORAL | Status: DC
  Administered 2016-06-15 – 2016-06-19 (×5): 1000 ug via ORAL
  Filled 2016-06-14 (×5): qty 1

## 2016-06-14 MED ORDER — AZITHROMYCIN 250 MG PO TABS: ORAL_TABLET | ORAL | 0 refills | Status: DC

## 2016-06-14 MED ORDER — LEVALBUTEROL HCL 1.25 MG/0.5ML IN NEBU
1.2500 mg | INHALATION_SOLUTION | Freq: Four times a day (QID) | RESPIRATORY_TRACT | Status: DC
  Administered 2016-06-14: 1.25 mg via RESPIRATORY_TRACT

## 2016-06-14 MED ORDER — MORPHINE SULFATE (PF) 4 MG/ML IV SOLN: 4.0000 mg | INTRAVENOUS | Status: DC | PRN

## 2016-06-14 MED ORDER — HYDROCODONE-ACETAMINOPHEN 5-325 MG PO TABS
1.0000 | ORAL_TABLET | Freq: Four times a day (QID) | ORAL | Status: DC | PRN
  Administered 2016-06-14 – 2016-06-17 (×3): 2 via ORAL
  Administered 2016-06-17: 1 via ORAL
  Administered 2016-06-18: 2 via ORAL
  Filled 2016-06-14 (×3): qty 2
  Filled 2016-06-14: qty 1
  Filled 2016-06-14: qty 2

## 2016-06-14 MED ORDER — METOPROLOL TARTRATE 5 MG/5ML IV SOLN: 5.0000 mg | INTRAVENOUS | Status: DC | PRN

## 2016-06-14 MED ORDER — BISACODYL 5 MG PO TBEC: 5.0000 mg | DELAYED_RELEASE_TABLET | Freq: Every day | ORAL | Status: DC | PRN

## 2016-06-14 MED ORDER — FENTANYL CITRATE (PF) 100 MCG/2ML IJ SOLN
50.0000 ug | Freq: Once | INTRAMUSCULAR | Status: AC
  Administered 2016-06-14: 50 ug via INTRAVENOUS
  Filled 2016-06-14: qty 2

## 2016-06-14 MED ORDER — SODIUM CHLORIDE 0.9 % IV SOLN: INTRAVENOUS | Status: DC

## 2016-06-14 NOTE — ED Triage Notes (Addendum)
Per EMS, pt from home c/o left hip pain after fall. Denies head injury, LOC, and dizziness. Shortening and outward rotation noted. 160mg Fentanyl and '4mg'$  Zofran with EMS.

## 2016-06-14 NOTE — ED Provider Notes (Signed)
Emergency Department Provider Note   I have reviewed the triage vital signs and the nursing notes.   HISTORY  Chief Complaint Fall and Hip Pain   HPI Karen Coffey is a 70 y.o. female with past nuchal history of COPD presents to the emergency department for evaluation of left hip pain in the setting of fall. The patient reports walking around the neighborhood with a friend today when she slipped and fell to the ground. She felt this pain in her left hip and was unable to walk afterwards. She denies any numbness or tingling in the hip or foot. Denies head injury or loss of consciousness. She denies any preceding symptoms of chest pain, difficult to breathing, or palpitations. She has been her typical state of health until today. Denies taking blood thinners.   Past Medical History:  Diagnosis Date  . COPD (chronic obstructive pulmonary disease) ([place])   . Lung mass   . Pleurisy 2008   left lung    Patient Active Problem List   Diagnosis Date Noted  . COPD exacerbation ([place]) 02/25/2015  . COPD, moderate ([place]) 12/18/2014  . COPD, severe ([place]) 03/26/2013  . Lung mass 11/02/2011  . Dyspnea 11/02/2011  . Cough 11/02/2011  . Tachycardia 11/02/2011  . Smoking 11/02/2011  . Cachexia ([place]) 11/02/2011  . Chest pain, musculoskeletal 11/02/2011    Past Surgical History:  Procedure Laterality Date  . TUBAL LIGATION  1986    Current Outpatient Rx  . Order #: [id number] Class: Historical Med  . Order #: [id number] Class: Historical Med  . Order #: [id number] Class: Historical Med  . Order #: [id number] Class: Historical Med  . Order #: [id number] Class: Normal  . Order #: [id number] Class: Historical Med  . Order #: [id number] Class: Historical Med  . Order #: [id number] Class: Normal  . Order #: [id number] Class: Normal  . Order #: [id number] Class: Normal  . Order #: [id number] Class: Normal  . Order #: [id number] Class: Normal  . Order #: [id number] Class: Print  . Order #: [id number] Class:  Historical Med  . Order #: [id number] Class: Normal  . Order #: [id number] Class: Normal    Allergies Albuterol sulfate  Family History  Problem Relation Age of Onset  . Asthma    . Heart disease Mother   . Cancer Father   . Pancreatic cancer      Social History Social History  Substance Use Topics  . Smoking status: Current Some Day Smoker    Types: Pipe  . Smokeless tobacco: Never Used     Comment: smoking 1/2 ppd (04/20/13) - smokes 1 pipe per day x70yr 12/18/14  . Alcohol use No    Review of Systems  Constitutional: No fever/chills Eyes: No visual changes. ENT: No sore throat. Cardiovascular: Denies chest pain. Respiratory: Denies shortness of breath. Gastrointestinal: No abdominal pain.  No nausea, no vomiting.  No diarrhea.  No constipation. Genitourinary: Negative for dysuria. Musculoskeletal: Negative for back pain. Positive left hip pain.  Skin: Negative for rash. Neurological: Negative for headaches, focal weakness or numbness.  10-point ROS otherwise negative.  ____________________________________________   PHYSICAL EXAM:  VITAL SIGNS: ED Triage Vitals  Enc Vitals Group     BP 06/14/16 1628 133/58     Pulse Rate 06/14/16 1628 79     Resp 06/14/16 1628 18     Temp 06/14/16 1628 97.9 F (36.6 C)     Temp Source 06/14/16 1628 Oral     SpO2 06/14/16 1627 91 %     Weight --      Height --  Head Circumference --      Peak Flow --      Pain Score 06/14/16 1631 10     Pain Loc --      Pain Edu? --      Excl. in [place]? --    Constitutional: Alert and oriented. Well appearing and in no acute distress. Eyes: Conjunctivae are normal. PERRL. EOMI. Head: Atraumatic. Nose: No congestion/rhinnorhea. Mouth/Throat: Mucous membranes are moist.  Oropharynx non-erythematous. Neck: No stridor. Cardiovascular: Normal rate, regular rhythm. Good peripheral circulation. Grossly normal heart sounds.   Respiratory: Normal respiratory effort.  No retractions. Lungs  CTAB. Gastrointestinal: Soft and nontender. No distention.  Musculoskeletal: Tremendous tenderness to palpation of the left hip. Left leg is shortened and externally rotated. Pulses and sensation intact.  Neurologic:  Normal speech and language. No gross focal neurologic deficits are appreciated.  Skin:  Skin is warm, dry and intact. No rash noted. Psychiatric: Mood and affect are normal. Speech and behavior are normal.  ____________________________________________   LABS (all labs ordered are listed, but only abnormal results are displayed)  Labs Reviewed  BASIC METABOLIC PANEL  CBC WITH DIFFERENTIAL/PLATELET  PROTIME-INR  URINALYSIS, ROUTINE W REFLEX MICROSCOPIC (NOT AT [facility])   ____________________________________________  EKG  Reviewed.  ____________________________________________  RADIOLOGY  Dg Hip Unilat With Pelvis 2-3 Views Left  Result Date: 06/14/2016 CLINICAL DATA:  Pt states she fell at home today onto left hip. Left hip pain since then. EXAM: DG HIP (WITH OR WITHOUT PELVIS) 2-3V LEFT COMPARISON:  None. FINDINGS: There is not intertrochanteric fracture of the left hip with little displacement or angulation. No dislocation or subluxation. IMPRESSION: Intertrochanteric fracture of the left hip. Electronically Signed   By: Nolon Nations M.D.   On: 06/14/2016 18:00    ____________________________________________   PROCEDURES  Procedure(s) performed:   Procedures  None ____________________________________________   INITIAL IMPRESSION / ASSESSMENT AND PLAN / ED COURSE  Pertinent labs & imaging results that were available during my care of the patient were reviewed by me and considered in my medical decision making (see chart for details).  Patient presents to the emergency department for evaluation of left hip pain in the setting of fall. She had an apparent mechanical fall at home with resulting left intertrochanteric hip fracture. Not anticoagulated. K  fentanyl for pain control. We'll add on additional labs and portable chest x-ray. Will place Foley catheter for patient comfort with hip fracture. Have paged orthopedic surgery for discussion of management.  06:47  Spoke with ortho who would like the patient transferred to [facility] for surgery likely tomorrow afternoon.   07:00 PM Discussed patient's case with hospitalist, Dr. Venetia Constable.  Recommend admission to med-surg, inpatient bed.  I will place holding orders per their request. Patient and family (if present) updated with plan. Care transferred to hospitalist service.  Placed EMTALA order with Dr. Hal Hope as the accepting physician at [facility] at request of Dr. Venetia Constable.   I reviewed all nursing notes, vitals, pertinent old records, EKGs, labs, imaging (as available).  ____________________________________________  FINAL CLINICAL IMPRESSION(S) / ED DIAGNOSES  Final diagnoses:  Hip fracture, left, closed, initial encounter ([place])     MEDICATIONS GIVEN DURING THIS VISIT:  Medications  fentaNYL (SUBLIMAZE) injection 50 mcg (50 mcg Intravenous Given 06/14/16 1759)     NEW OUTPATIENT MEDICATIONS STARTED DURING THIS VISIT:  None   Note:  This document was prepared using Dragon voice recognition software and may include unintentional dictation errors.  Nanda Quinton, MD Emergency  Medicine   Margette Fast, MD 06/15/16 [phone]

## 2016-06-14 NOTE — H&P (Addendum)
History and Physical    Tenya Araque ZOX:[id number] DOB: 1946-08-20 DOA: 06/14/2016  PCP: Maryruth Eve, MD   Patient coming from: Home   Chief Complaint: Left hip pain after fall   HPI: Debany Vantol is a 70 y.o. female with medical history significant for COPD and non-small cell lung cancer in clinical remission since October 2015, presenting to the emergency department with severe left hip pain and deformity after a ground-level mechanical fall just prior to arrival. Patient reports a recent flare in her COPD marked by increased cough and wheezing over the past 2 weeks. She reached her pulmonologist by phone with these complaints and was prescribed a course of prednisone and azithromycin. She has not yet picked up these medications, but reports her respiratory status to be improved today. In fact, when she fell, she had been out for a walk around the neighborhood after picking up her mail and doing some chores around the house. She was walking on an uneven, rocky surface, attempting to avoid a slope, but slipped, sliding down the slope, and suffering immediate and severe pain at the left hip with inability to ambulate. EMS was activated, left leg was noted to be shortened and externally rotated, and fentanyl and Zofran were administered en route to the hospital. Patient denies any history of heart disease, chest pain, palpitations, lower extremity edema, or orthopnea. She denies recent fevers or chills. She denies abdominal pain, nausea, vomiting, diarrhea, or constipation. There has been no dysuria, suprapubic tenderness, or flank pain. Patient has never experienced general anesthesia before and is unaware of any family history of complications with anesthesia.  ED Course: Upon arrival to the ED, patient is found to be afebrile, saturating low 90s on room air, and with vital signs otherwise stable. Radiographs of the hips demonstrate an intertrochanteric fracture of the left hip. Chest x-ray is  notable for hyperinflation and bronchitic changes, but negative for acute cardiopulmonary disease. Chemistry panel was unremarkable and CBC is notable for a leukocytosis to 11,500 and mild normocytic anemia with hemoglobin of 11.6. INR is within the normal limits at 1.06. EKG and urinalysis have been ordered and remained pending. Orthopedic surgery was consulted by the ED physician and advised admission to [facility] for anticipated operative management tomorrow afternoon. Patient was provided symptomatic care with fentanyl and morphine in the emergency department and remained hemodynamically stable. O2 saturations hovered in the low 90s, occasionally dipping to 88%, and supplemental oxygen was provided with 2 L/m via nasal cannula. Patient will be admitted to the medical/surgical unit at [facility] for ongoing evaluation and management of closed left hip fracture.  Review of Systems:  All other systems reviewed and apart from HPI, are negative.  Past Medical History:  Diagnosis Date  . COPD (chronic obstructive pulmonary disease) ([place])   . Lung mass   . Pleurisy 2008   left lung    Past Surgical History:  Procedure Laterality Date  . TUBAL LIGATION  1986     reports that she has been smoking Pipe.  She has never used smokeless tobacco. She reports that she does not drink alcohol or use drugs.  Allergies  Allergen Reactions  . Albuterol Sulfate     tachycardia    Family History  Problem Relation Age of Onset  . Asthma    . Heart disease Mother   . Cancer Father   . Pancreatic cancer       Prior to Admission medications   Medication Sig Start Date  End Date Taking? Authorizing Provider  aspirin 81 MG chewable tablet Chew 81 mg by mouth daily.  05/21/16  Yes Historical Provider, MD  atorvastatin (LIPITOR) 10 MG tablet Take 10 mg by mouth daily.  05/25/16  Yes Historical Provider, MD  [employer] VITAMIN B12 1000 MCG tablet Take 1,000 mcg by mouth daily. 05/24/16  Yes  Historical Provider, MD  Cyanocobalamin (RA VITAMIN B-12 TR) 1000 MCG TBCR Take by mouth. 05/24/16  Yes Historical Provider, MD  levalbuterol (XOPENEX HFA) 45 MCG/ACT inhaler INHALE 1-2 PUFFS INTO THE LUNGS EVERY 4 HRS AS NEEDED FOR WHEEZING 11/27/15  Yes Brand Males, MD  Tiotropium Bromide Monohydrate (SPIRIVA RESPIMAT) 2.5 MCG/ACT AERS INHALE 2 PUFFS INTO THE LUNGS DAILY. 03/27/16  Yes Historical Provider, MD  Vitamin D, Ergocalciferol, (DRISDOL) 50000 units CAPS capsule TAKE 50,000 UNITS BY MOUTH EVERY 7 DAYS. 05/25/16  Yes Historical Provider, MD  azithromycin (ZITHROMAX) 250 MG tablet Take 2 today, then 1 daily until gone. Patient not taking: Reported on 06/14/2016 06/14/16   Rigoberto Noel, MD  BREO ELLIPTA 100-25 MCG/INH AEPB INHALE 1 PUFF INTO THE LUNGS DAILY. INS WILL PAY ON 03-24-15 Patient not taking: Reported on 06/14/2016 01/29/16   Brand Males, MD  doxycycline (VIBRA-TABS) 100 MG tablet Take 1 tablet (100 mg total) by mouth 2 (two) times daily. Patient not taking: Reported on 06/14/2016 09/22/15   Brand Males, MD  doxycycline (VIBRA-TABS) 100 MG tablet Take 1 po BID x 5 days; take after meals and avoid sunlight Patient not taking: Reported on 06/14/2016 12/02/15   Brand Males, MD  fluticasone furoate-vilanterol (BREO ELLIPTA) 100-25 MCG/INH AEPB Inhale 1 puff into the lungs daily. Patient not taking: Reported on 06/14/2016 01/28/16   Brand Males, MD  HYDROcodone-acetaminophen (NORCO/VICODIN) 5-325 MG per tablet Take 1 tablet by mouth every 6 (six) hours as needed for severe pain. Patient not taking: Reported on 06/14/2016 07/21/15   Leo Grosser, MD  ibuprofen (ADVIL,MOTRIN) 200 MG tablet Take 200 mg by mouth every 6 (six) hours as needed for headache, mild pain or moderate pain.    Historical Provider, MD  predniSONE (DELTASONE) 10 MG tablet Take 2 tabs daily X5 days, then 1 tab daily X5 days, then stop. Patient not taking: Reported on 06/14/2016 06/14/16   Rigoberto Noel, MD    Tiotropium Bromide Monohydrate (SPIRIVA RESPIMAT) 2.5 MCG/ACT AERS Inhale 2 puffs into the lungs daily. 01/28/16 06/02/16  Brand Males, MD    Physical Exam: Vitals:   06/14/16 1758 06/14/16 1848 06/14/16 1900 06/14/16 1931  BP: 138/62 (!) 138/52 148/57 150/70  Pulse: 74 75 76 81  Resp: (!) '28 25 20 21  '$ Temp:  97.9 F (36.6 C)  97.6 F (36.4 C)  TempSrc:  Oral  Oral  SpO2: 92% 94% 95% 93%  Weight:    59 kg (130 lb)  Height:    '5\' 8"'$  (1.727 m)      Constitutional: NAD, calm, comfortable Eyes: PERTLA, lids and conjunctivae normal ENMT: Mucous membranes are dry. Posterior pharynx clear of any exudate or lesions.   Neck: normal, supple, no masses, no thyromegaly Respiratory: Mildly diminished bilaterally, lungs CTAB, no wheezes, rhonchi, or crackles. Normal respiratory effort.   Cardiovascular: S1 & S2 heard, regular rate and rhythm, no significant murmurs. No extremity edema. 2+ pedal pulses. No significant JVD. Abdomen: No distension, no tenderness, no masses palpated. Bowel sounds normal.  Musculoskeletal: no clubbing / cyanosis. Deformity overlies left hip anteriorly; LLE shortened and externally rotated. NVI distal to deformity.  Normal muscle tone.  Skin: no significant rashes, lesions, ulcers. Warm, dry, well-perfused. Neurologic: CN 2-12 grossly intact. Sensation intact, DTR normal. Strength 5/5 in all 4 limbs.  Psychiatric: Normal judgment and insight. Alert and oriented x 3. Normal mood and affect.     Labs on Admission: I have personally reviewed following labs and imaging studies  CBC:  Recent Labs Lab 06/14/16 1830  WBC 11.5*  NEUTROABS 9.7*  HGB 11.6*  HCT 34.6*  MCV 89.6  PLT 580   Basic Metabolic Panel:  Recent Labs Lab 06/14/16 1830  NA 135  K 4.1  CL 104  CO2 25  GLUCOSE 106*  BUN 9  CREATININE 0.86  CALCIUM 8.9   GFR: Estimated Creatinine Clearance: 55.9 mL/min (by C-G formula based on SCr of 0.86 mg/dL). Liver Function Tests: No  results for input(s): AST, ALT, ALKPHOS, BILITOT, PROT, ALBUMIN in the last 168 hours. No results for input(s): LIPASE, AMYLASE in the last 168 hours. No results for input(s): AMMONIA in the last 168 hours. Coagulation Profile:  Recent Labs Lab 06/14/16 1830  INR 1.06   Cardiac Enzymes: No results for input(s): CKTOTAL, CKMB, CKMBINDEX, TROPONINI in the last 168 hours. BNP (last 3 results) No results for input(s): PROBNP in the last 8760 hours. HbA1C: No results for input(s): HGBA1C in the last 72 hours. CBG: No results for input(s): GLUCAP in the last 168 hours. Lipid Profile: No results for input(s): CHOL, HDL, LDLCALC, TRIG, CHOLHDL, LDLDIRECT in the last 72 hours. Thyroid Function Tests: No results for input(s): TSH, T4TOTAL, FREET4, T3FREE, THYROIDAB in the last 72 hours. Anemia Panel: No results for input(s): VITAMINB12, FOLATE, FERRITIN, TIBC, IRON, RETICCTPCT in the last 72 hours. Urine analysis: No results found for: COLORURINE, APPEARANCEUR, LABSPEC, PHURINE, GLUCOSEU, HGBUR, BILIRUBINUR, KETONESUR, PROTEINUR, UROBILINOGEN, NITRITE, LEUKOCYTESUR Sepsis Labs: '@LABRCNTIP'$ (procalcitonin:4,lacticidven:4) )No results found for this or any previous visit (from the past 240 hour(s)).   Radiological Exams on Admission: Dg Chest 1 View  Result Date: 06/14/2016 CLINICAL DATA:  Peri-op, hip fracture. Hx of COPD, lung mass. Current every day smoker. EXAM: CHEST 1 VIEW COMPARISON:  None. FINDINGS: Lungs are hyperinflated. There is perihilar peribronchial thickening. Biapical pleural parenchymal thickening is identified, right greater than left and appears chronic. No focal consolidations or pleural effusions. No pulmonary edema. IMPRESSION: Hyperinflation and bronchitic change. No evidence for acute pulmonary abnormality. Electronically Signed   By: Nolon Nations M.D.   On: 06/14/2016 19:12   Dg Hip Unilat With Pelvis 2-3 Views Left  Result Date: 06/14/2016 CLINICAL DATA:  Pt  states she fell at home today onto left hip. Left hip pain since then. EXAM: DG HIP (WITH OR WITHOUT PELVIS) 2-3V LEFT COMPARISON:  None. FINDINGS: There is not intertrochanteric fracture of the left hip with little displacement or angulation. No dislocation or subluxation. IMPRESSION: Intertrochanteric fracture of the left hip. Electronically Signed   By: Nolon Nations M.D.   On: 06/14/2016 18:00    EKG: Independently reviewed: Normal sinus rhythm.  Assessment/Plan  1. Closed left hip fracture, initial encounter  - Pt suffered ground-level mechanical fall just PTA with no head strike or LOC  - Radiographs reveal intertrochanteric fx of left femur  - Orthopedic surgery consulting and much appreciated; tentative plans for surgery the afternoon of 06/15/16  - Pt has no known underlying heart disease and no arrhythmia noted on telemetry monitoring in ED, EKG nsr   - UA pending; no s/s of UTI, but would consider treating asymptomatic bacteriuria in preoperative  setting  - INR wnl; type and screen to be collected upon [facility] to [facility]  - CXR clear; pt with COPD, no evidence of exacerbation; management as below  - Euvolemic to slightly dry on admission, providing gentle IVF hydration with NS at 75 cc/hr  - Maintain normotension with prn Lopressor 5 mg IVP's  - Hold ASA 81; last dose the am of 06/14/16  - Only significant pt-related perioperative risk factor is COPD with marginal O2 sat - Pt presents a moderate perioperative cardiopulmonary risk for the proposed surgery   2. COPD  - Pt reports recent flare marked by increased cough and dyspnea, but seems to have resolved - No suggestion of exacerbation upon admission; CXR with chronic bronchitic changes  - Does not require supplemental O2 at her baseline; saturating low 90's on rm air in ED - Continue Spiriva as at home; start DuoNeb q6h  - Monitor with continuous pulse oximetry and titrate FiO2 to maintain sat >92%    3. Leukocytosis  -  WBC 11,500 on admission without fever or apparent focus of infection  - UA remains pending, but suspect this is reflective of physiologic stress of femur fracture  - Follow-up UA, treat as appropriate; culture if febrile    4. Normocytic anemia  - Hgb 11.6 on admission, had been wnl on the 1 other data point available from January 2013  - There is no s/s of active blood loss  - Check iron studies, B12, and folate; supplement prn  - Type and screen ordered preoperatively, to be collected upon arrival to [facility]    DVT prophylaxis: One dose sq heparin on admission, right SCD, further ppx per ortho preference  Code Status: Full  Family Communication: Discussed with patient  Disposition Plan: Admit to med-surg  Consults called: Orthopedic surgery Admission status: Inpatient    Vianne Bulls, MD [facility] Pager [phone]  If 7PM-7AM, please contact night-coverage [email or web address] Password [facility]  06/14/2016, 7:55 PM

## 2016-06-14 NOTE — ED Notes (Signed)
Called Inez, rn at [facility] to give handoff and transport -  Report in process    Called [facility] for transport.

## 2016-06-14 NOTE — ED Notes (Signed)
Per protocol not placing foley in ED. Will wait until transported to the floor.

## 2016-06-14 NOTE — ED Notes (Signed)
Pt urinated in bed pan. Urine collected at bed side. Provider hospitalist in room verbalizes NPO after midnight and ok to give sips

## 2016-06-14 NOTE — Telephone Encounter (Signed)
Prednisone 10 mg tabs  Take 2 tabs daily with food x 5ds, then 1 tab daily with food x 5ds then STOP Z-pak  Needs OV

## 2016-06-14 NOTE — ED Notes (Signed)
Pt told she can't have food or water.

## 2016-06-14 NOTE — Telephone Encounter (Signed)
Spoke with pt, aware of recs.  rx's sent to preferred pharmacy.  Pt states she does not have reliable transportation at this time and will have to call back to schedule an appt after speaking to her transportation.  I stressed the importance of a rov.  Pt expressed understanding.  Nothing further needed.

## 2016-06-14 NOTE — Telephone Encounter (Signed)
Spoke with pt. States that she is having a COPD flare. Reports increased coughing for 2 weeks. Denies chest tightness, wheezing or SOB. Has not tried any OTC meds. Would like to have some prednisone sent in.  RA - please advise as MR is on vacation. Thanks.

## 2016-06-14 NOTE — Progress Notes (Signed)
EDCM spoke to patient at bedside.  Patient reports she lives at home alone.  She reports her two daughters Sharyn Lull and Glenard Haring rotate taking care of the patient.  She reports she  Has a son who is coming home from the service in August.  She reports she has 2 canes and a shower chair at home.  She reports she has had someone come out twice a week to check her blood pressure, "But they just stopped coming." She is unable to remember the name of the agency.  EDCM provided patient with list of home health agencies in [place] explained services.  Patient thankful for resources.  No further EDCM needs at this time.

## 2016-06-14 NOTE — ED Notes (Signed)
Patient transported to X-ray 

## 2016-06-15 ENCOUNTER — Inpatient Hospital Stay (HOSPITAL_COMMUNITY): Payer: Medicare Other | Admitting: Anesthesiology

## 2016-06-15 ENCOUNTER — Encounter (HOSPITAL_COMMUNITY): Payer: Self-pay | Admitting: Anesthesiology

## 2016-06-15 ENCOUNTER — Inpatient Hospital Stay (HOSPITAL_COMMUNITY): Payer: Medicare Other

## 2016-06-15 ENCOUNTER — Encounter (HOSPITAL_COMMUNITY)
Admission: EM | Disposition: A | Discharge status: Skilled Nursing Facility | Payer: Self-pay | Source: Home / Self Care | Attending: Internal Medicine

## 2016-06-15 HISTORY — PX: INTRAMEDULLARY (IM) NAIL INTERTROCHANTERIC: SHX5875

## 2016-06-15 LAB — CREATININE, SERUM: CREATININE: 0.91 mg/dL (ref 0.44–1.00)

## 2016-06-15 LAB — CBC
HEMATOCRIT: 31.5 % — AB (ref 36.0–46.0)
HEMOGLOBIN: 10.1 g/dL — AB (ref 12.0–15.0)
MCH: 29.4 pg (ref 26.0–34.0)
MCHC: 32.1 g/dL (ref 30.0–36.0)
MCV: 91.8 fL (ref 78.0–100.0)
Platelets: 185 10*3/uL (ref 150–400)
RBC: 3.43 MIL/uL — ABNORMAL LOW (ref 3.87–5.11)
RDW: 13.4 % (ref 11.5–15.5)
WBC: 10 10*3/uL (ref 4.0–10.5)

## 2016-06-15 SURGERY — FIXATION, FRACTURE, INTERTROCHANTERIC, WITH INTRAMEDULLARY ROD
Anesthesia: General | Site: Hip | Laterality: Left

## 2016-06-15 MED ORDER — FENTANYL CITRATE (PF) 250 MCG/5ML IJ SOLN
INTRAMUSCULAR | Status: AC
Start: 2016-06-15 — End: 2016-06-15
  Filled 2016-06-15: qty 5

## 2016-06-15 MED ORDER — FENTANYL CITRATE (PF) 100 MCG/2ML IJ SOLN
INTRAMUSCULAR | Status: AC
  Administered 2016-06-15: 25 ug via INTRAVENOUS
  Filled 2016-06-15: qty 2

## 2016-06-15 MED ORDER — METOCLOPRAMIDE HCL 5 MG PO TABS: 5.0000 mg | ORAL_TABLET | Freq: Three times a day (TID) | ORAL | Status: DC | PRN

## 2016-06-15 MED ORDER — LIDOCAINE HCL (CARDIAC) 20 MG/ML IV SOLN
INTRAVENOUS | Status: DC | PRN
  Administered 2016-06-15: 100 mg via INTRAVENOUS

## 2016-06-15 MED ORDER — CEFAZOLIN SODIUM-DEXTROSE 2-4 GM/100ML-% IV SOLN
2.0000 g | Freq: Four times a day (QID) | INTRAVENOUS | Status: AC
  Administered 2016-06-15 (×3): 2 g via INTRAVENOUS
  Filled 2016-06-15 (×3): qty 100

## 2016-06-15 MED ORDER — METHOCARBAMOL 1000 MG/10ML IJ SOLN
500.0000 mg | INTRAVENOUS | Status: AC
  Administered 2016-06-15: 500 mg via INTRAVENOUS
  Filled 2016-06-15 (×2): qty 5

## 2016-06-15 MED ORDER — CETYLPYRIDINIUM CHLORIDE 0.05 % MT LIQD
7.0000 mL | Freq: Two times a day (BID) | OROMUCOSAL | Status: DC
  Administered 2016-06-15 – 2016-06-19 (×7): 7 mL via OROMUCOSAL

## 2016-06-15 MED ORDER — OXYCODONE HCL 5 MG PO TABS
5.0000 mg | ORAL_TABLET | ORAL | Status: DC | PRN
  Administered 2016-06-15 – 2016-06-16 (×4): 10 mg via ORAL
  Administered 2016-06-16: 5 mg via ORAL
  Administered 2016-06-16: 10 mg via ORAL
  Administered 2016-06-18: 5 mg via ORAL
  Filled 2016-06-15 (×2): qty 2
  Filled 2016-06-15: qty 1
  Filled 2016-06-15 (×2): qty 2
  Filled 2016-06-15: qty 1
  Filled 2016-06-15: qty 2

## 2016-06-15 MED ORDER — ENSURE ENLIVE PO LIQD
237.0000 mL | Freq: Two times a day (BID) | ORAL | Status: DC
  Administered 2016-06-15 – 2016-06-19 (×6): 237 mL via ORAL

## 2016-06-15 MED ORDER — PHENYLEPHRINE 40 MCG/ML (10ML) SYRINGE FOR IV PUSH (FOR BLOOD PRESSURE SUPPORT)
PREFILLED_SYRINGE | INTRAVENOUS | Status: AC
  Filled 2016-06-15: qty 10

## 2016-06-15 MED ORDER — EPHEDRINE 5 MG/ML INJ
INTRAVENOUS | Status: AC
  Filled 2016-06-15: qty 10

## 2016-06-15 MED ORDER — MORPHINE SULFATE (PF) 2 MG/ML IV SOLN: 0.5000 mg | INTRAVENOUS | Status: DC | PRN

## 2016-06-15 MED ORDER — LIDOCAINE 2% (20 MG/ML) 5 ML SYRINGE
INTRAMUSCULAR | Status: AC
  Filled 2016-06-15: qty 5

## 2016-06-15 MED ORDER — PROPOFOL 10 MG/ML IV BOLUS
INTRAVENOUS | Status: DC | PRN
  Administered 2016-06-15: 100 mg via INTRAVENOUS

## 2016-06-15 MED ORDER — ENOXAPARIN SODIUM 40 MG/0.4ML ~~LOC~~ SOLN: 40.0000 mg | Freq: Every day | SUBCUTANEOUS | 0 refills | Status: DC

## 2016-06-15 MED ORDER — DEXTROSE 5 % IV SOLN
1.0000 g | INTRAVENOUS | Status: DC
  Administered 2016-06-15 – 2016-06-18 (×4): 1 g via INTRAVENOUS
  Filled 2016-06-15 (×5): qty 10

## 2016-06-15 MED ORDER — HYDROCODONE-ACETAMINOPHEN 5-325 MG PO TABS: 1.0000 | ORAL_TABLET | Freq: Four times a day (QID) | ORAL | Status: DC | PRN

## 2016-06-15 MED ORDER — METHOCARBAMOL 500 MG PO TABS
500.0000 mg | ORAL_TABLET | ORAL | Status: AC
  Filled 2016-06-15: qty 1

## 2016-06-15 MED ORDER — PHENOL 1.4 % MT LIQD: 1.0000 | OROMUCOSAL | Status: DC | PRN

## 2016-06-15 MED ORDER — ALUM & MAG HYDROXIDE-SIMETH 200-200-20 MG/5ML PO SUSP: 30.0000 mL | ORAL | Status: DC | PRN

## 2016-06-15 MED ORDER — MENTHOL 3 MG MT LOZG: 1.0000 | LOZENGE | OROMUCOSAL | Status: DC | PRN

## 2016-06-15 MED ORDER — MIDAZOLAM HCL 2 MG/2ML IJ SOLN
INTRAMUSCULAR | Status: AC
  Filled 2016-06-15: qty 2

## 2016-06-15 MED ORDER — ROCURONIUM BROMIDE 100 MG/10ML IV SOLN
INTRAVENOUS | Status: DC | PRN
  Administered 2016-06-15: 40 mg via INTRAVENOUS

## 2016-06-15 MED ORDER — SUGAMMADEX SODIUM 200 MG/2ML IV SOLN
INTRAVENOUS | Status: DC | PRN
  Administered 2016-06-15: 150 mg via INTRAVENOUS

## 2016-06-15 MED ORDER — FENTANYL CITRATE (PF) 100 MCG/2ML IJ SOLN
INTRAMUSCULAR | Status: DC | PRN
  Administered 2016-06-15 (×4): 50 ug via INTRAVENOUS

## 2016-06-15 MED ORDER — SUGAMMADEX SODIUM 200 MG/2ML IV SOLN
INTRAVENOUS | Status: AC
  Filled 2016-06-15: qty 2

## 2016-06-15 MED ORDER — ONDANSETRON HCL 4 MG/2ML IJ SOLN
INTRAMUSCULAR | Status: DC | PRN
  Administered 2016-06-15: 4 mg via INTRAVENOUS

## 2016-06-15 MED ORDER — ACETAMINOPHEN 650 MG RE SUPP: 650.0000 mg | Freq: Four times a day (QID) | RECTAL | Status: DC | PRN

## 2016-06-15 MED ORDER — FENTANYL CITRATE (PF) 100 MCG/2ML IJ SOLN
25.0000 ug | INTRAMUSCULAR | Status: DC | PRN
  Administered 2016-06-15 (×2): 25 ug via INTRAVENOUS

## 2016-06-15 MED ORDER — ACETAMINOPHEN 325 MG PO TABS
650.0000 mg | ORAL_TABLET | Freq: Four times a day (QID) | ORAL | Status: DC | PRN
  Administered 2016-06-19: 650 mg via ORAL
  Filled 2016-06-15: qty 2

## 2016-06-15 MED ORDER — ONDANSETRON HCL 4 MG PO TABS: 4.0000 mg | ORAL_TABLET | Freq: Four times a day (QID) | ORAL | Status: DC | PRN

## 2016-06-15 MED ORDER — EPHEDRINE SULFATE 50 MG/ML IJ SOLN
INTRAMUSCULAR | Status: DC | PRN
  Administered 2016-06-15: 10 mg via INTRAVENOUS

## 2016-06-15 MED ORDER — ONDANSETRON HCL 4 MG/2ML IJ SOLN
INTRAMUSCULAR | Status: AC
  Filled 2016-06-15: qty 2

## 2016-06-15 MED ORDER — SODIUM CHLORIDE 0.9 % IV SOLN
INTRAVENOUS | Status: DC
  Administered 2016-06-15 – 2016-06-16 (×3): via INTRAVENOUS

## 2016-06-15 MED ORDER — ONDANSETRON HCL 4 MG/2ML IJ SOLN
4.0000 mg | Freq: Four times a day (QID) | INTRAMUSCULAR | Status: DC | PRN
  Administered 2016-06-17: 4 mg via INTRAVENOUS
  Filled 2016-06-15: qty 2

## 2016-06-15 MED ORDER — ALBUTEROL SULFATE HFA 108 (90 BASE) MCG/ACT IN AERS
INHALATION_SPRAY | RESPIRATORY_TRACT | Status: DC | PRN
  Administered 2016-06-15: 2 via RESPIRATORY_TRACT

## 2016-06-15 MED ORDER — PROPOFOL 10 MG/ML IV BOLUS
INTRAVENOUS | Status: AC
  Filled 2016-06-15: qty 20

## 2016-06-15 MED ORDER — OXYCODONE HCL 5 MG PO TABS: 5.0000 mg | ORAL_TABLET | ORAL | 0 refills | Status: DC | PRN

## 2016-06-15 MED ORDER — 0.9 % SODIUM CHLORIDE (POUR BTL) OPTIME
TOPICAL | Status: DC | PRN
  Administered 2016-06-15: 1000 mL

## 2016-06-15 MED ORDER — ALBUTEROL SULFATE HFA 108 (90 BASE) MCG/ACT IN AERS
INHALATION_SPRAY | RESPIRATORY_TRACT | Status: AC
  Filled 2016-06-15: qty 6.7

## 2016-06-15 MED ORDER — PHENYLEPHRINE HCL 10 MG/ML IJ SOLN
INTRAMUSCULAR | Status: DC | PRN
  Administered 2016-06-15: 120 ug via INTRAVENOUS
  Administered 2016-06-15: 80 ug via INTRAVENOUS

## 2016-06-15 MED ORDER — CEFAZOLIN SODIUM-DEXTROSE 2-3 GM-% IV SOLR
2.0000 g | Freq: Once | INTRAVENOUS | Status: AC
  Administered 2016-06-15: 2 g via INTRAVENOUS
  Filled 2016-06-15: qty 50

## 2016-06-15 MED ORDER — METOCLOPRAMIDE HCL 5 MG/ML IJ SOLN: 5.0000 mg | Freq: Three times a day (TID) | INTRAMUSCULAR | Status: DC | PRN

## 2016-06-15 MED ORDER — ENOXAPARIN SODIUM 40 MG/0.4ML ~~LOC~~ SOLN
40.0000 mg | SUBCUTANEOUS | Status: DC
  Administered 2016-06-16 – 2016-06-19 (×4): 40 mg via SUBCUTANEOUS
  Filled 2016-06-15 (×4): qty 0.4

## 2016-06-15 MED ORDER — ROCURONIUM BROMIDE 50 MG/5ML IV SOLN
INTRAVENOUS | Status: AC
  Filled 2016-06-15: qty 1

## 2016-06-15 MED ORDER — LACTATED RINGERS IV SOLN
INTRAVENOUS | Status: DC | PRN
Start: 2016-06-15 — End: 2016-06-15
  Administered 2016-06-15 (×2): via INTRAVENOUS

## 2016-06-15 MED ORDER — FENTANYL CITRATE (PF) 250 MCG/5ML IJ SOLN
INTRAMUSCULAR | Status: AC
  Filled 2016-06-15: qty 5

## 2016-06-15 MED ORDER — CEFAZOLIN SODIUM-DEXTROSE 2-4 GM/100ML-% IV SOLN
INTRAVENOUS | Status: AC
  Administered 2016-06-15: 2 g via INTRAVENOUS
  Filled 2016-06-15: qty 100

## 2016-06-15 SURGICAL SUPPLY — 44 items
BLADE SURG 15 STRL LF DISP TIS (BLADE) ×1 IMPLANT
BLADE SURG 15 STRL SS (BLADE) ×2
BNDG COHESIVE 4X5 TAN NS LF (GAUZE/BANDAGES/DRESSINGS) ×3 IMPLANT
BNDG COHESIVE 6X5 TAN STRL LF (GAUZE/BANDAGES/DRESSINGS) IMPLANT
BNDG GAUZE ELAST 4 BULKY (GAUZE/BANDAGES/DRESSINGS) ×3 IMPLANT
COVER PERINEAL POST (MISCELLANEOUS) ×3 IMPLANT
COVER SURGICAL LIGHT HANDLE (MISCELLANEOUS) ×3 IMPLANT
DRAPE PROXIMA HALF (DRAPES) IMPLANT
DRAPE STERI IOBAN 125X83 (DRAPES) ×3 IMPLANT
DRESSING ALLEVYN LIFE SACRUM (GAUZE/BANDAGES/DRESSINGS) ×3 IMPLANT
DRSG MEPILEX BORDER 4X4 (GAUZE/BANDAGES/DRESSINGS) ×6 IMPLANT
DRSG MEPILEX BORDER 4X8 (GAUZE/BANDAGES/DRESSINGS) ×3 IMPLANT
DRSG PAD ABDOMINAL 8X10 ST (GAUZE/BANDAGES/DRESSINGS) ×6 IMPLANT
DURAPREP 26ML APPLICATOR (WOUND CARE) ×3 IMPLANT
ELECT CAUTERY BLADE 6.4 (BLADE) ×3 IMPLANT
ELECT REM PT RETURN 9FT ADLT (ELECTROSURGICAL) ×3
ELECTRODE REM PT RTRN 9FT ADLT (ELECTROSURGICAL) ×1 IMPLANT
FACESHIELD WRAPAROUND (MASK) ×3 IMPLANT
GAUZE XEROFORM 5X9 LF (GAUZE/BANDAGES/DRESSINGS) ×3 IMPLANT
GLOVE SKINSENSE NS SZ7.5 (GLOVE) ×4
GLOVE SKINSENSE STRL SZ7.5 (GLOVE) ×2 IMPLANT
GOWN STRL REIN XL XLG (GOWN DISPOSABLE) ×3 IMPLANT
GUIDE PIN 3.2X343 (PIN) ×2
GUIDE PIN 3.2X343MM (PIN) ×6
KIT BASIN OR (CUSTOM PROCEDURE TRAY) ×3 IMPLANT
KIT ROOM TURNOVER OR (KITS) ×3 IMPLANT
LINER BOOT UNIVERSAL DISP (MISCELLANEOUS) ×3 IMPLANT
MANIFOLD NEPTUNE II (INSTRUMENTS) ×3 IMPLANT
NAIL TRIGEN 10MMX40CM-125 LEFT (Nail) ×3 IMPLANT
NS IRRIG 1000ML POUR BTL (IV SOLUTION) ×3 IMPLANT
PACK GENERAL/GYN (CUSTOM PROCEDURE TRAY) ×3 IMPLANT
PAD ARMBOARD 7.5X6 YLW CONV (MISCELLANEOUS) ×6 IMPLANT
PAD CAST 4YDX4 CTTN HI CHSV (CAST SUPPLIES) ×2 IMPLANT
PADDING CAST COTTON 4X4 STRL (CAST SUPPLIES) ×4
PIN GUIDE 3.2X343MM (PIN) ×2 IMPLANT
SCREW LAG COMPR KIT 95/90 (Screw) ×3 IMPLANT
STAPLER VISISTAT 35W (STAPLE) ×3 IMPLANT
SUT VIC AB 0 CT1 27 (SUTURE) ×6
SUT VIC AB 0 CT1 27XBRD ANBCTR (SUTURE) ×2 IMPLANT
SUT VIC AB 2-0 CT1 27 (SUTURE) ×4
SUT VIC AB 2-0 CT1 TAPERPNT 27 (SUTURE) ×2 IMPLANT
TOWEL OR 17X24 6PK STRL BLUE (TOWEL DISPOSABLE) ×3 IMPLANT
TOWEL OR 17X26 10 PK STRL BLUE (TOWEL DISPOSABLE) ×3 IMPLANT
WATER STERILE IRR 1000ML POUR (IV SOLUTION) ×3 IMPLANT

## 2016-06-15 NOTE — Anesthesia Preprocedure Evaluation (Addendum)
Anesthesia Evaluation  Patient identified by MRN, date of birth, ID band Patient awake    Reviewed: Allergy & Precautions, H&P , Patient's Chart, lab work & pertinent test results, reviewed documented beta blocker date and time   Airway Mallampati: II  TM Distance: >3 FB Neck ROM: full    Dental no notable dental hx. (+) Edentulous Upper, Edentulous Lower, Dental Advisory Given   Pulmonary shortness of breath and with exertion, COPD,  COPD inhaler and oxygen dependent, Current Smoker,    Pulmonary exam normal breath sounds clear to auscultation       Cardiovascular negative cardio ROS   Rhythm:regular Rate:Normal     Neuro/Psych    GI/Hepatic negative GI ROS, Neg liver ROS,   Endo/Other  negative endocrine ROS  Renal/GU negative Renal ROS     Musculoskeletal   Abdominal   Peds negative pediatric ROS (+)  Hematology   Anesthesia Other Findings   Reproductive/Obstetrics negative OB ROS                            Anesthesia Physical Anesthesia Plan  ASA: II  Anesthesia Plan: General   Post-op Pain Management:    Induction: Intravenous  Airway Management Planned: Oral ETT  Additional Equipment:   Intra-op Plan:   Post-operative Plan: Extubation in OR  Informed Consent: I have reviewed the patients History and Physical, chart, labs and discussed the procedure including the risks, benefits and alternatives for the proposed anesthesia with the patient or authorized representative who has indicated his/her understanding and acceptance.   Dental Advisory Given and Dental advisory given  Plan Discussed with: CRNA and Surgeon  Anesthesia Plan Comments: (  Discussed general anesthesia, including possible nausea, instrumentation of airway, sore throat,pulmonary aspiration, etc. I asked if the were any outstanding questions, or  concerns before we proceeded. )        Anesthesia  Quick Evaluation

## 2016-06-15 NOTE — Anesthesia Procedure Notes (Signed)
Procedure Name: Intubation Date/Time: 06/15/2016 7:24 AM Performed by: Jenne [facility] Pre-anesthesia Checklist: Patient identified, Emergency Drugs available, Suction available and Patient being monitored Patient Re-evaluated:Patient Re-evaluated prior to inductionOxygen Delivery Method: Circle System Utilized Preoxygenation: Pre-oxygenation with 100% oxygen Intubation Type: IV induction Ventilation: Mask ventilation without difficulty Laryngoscope Size: Miller and 2 Grade View: Grade I Tube type: Oral Tube size: 7.0 mm Number of attempts: 1 Airway Equipment and Method: Stylet and Oral airway Placement Confirmation: ETT inserted through vocal cords under direct vision,  positive ETCO2 and breath sounds checked- equal and bilateral Secured at: 20 cm Tube secured with: Tape Dental Injury: Teeth and Oropharynx as per pre-operative assessment

## 2016-06-15 NOTE — Consult Note (Signed)
ORTHOPAEDIC CONSULTATION  REQUESTING PHYSICIAN: Modena Jansky, MD  Chief Complaint: Left intertroch hip fracture  HPI: Karen Coffey is a 70 y.o. female who presents with left hip fracture s/p mechanical fall PTA.  The patient endorses severe pain in the left hip, that does not radiate, grinding in quality, worse with any movement, better with immobilization.  Denies LOC/fever/chills/nausea/vomiting.  Walks without assistive devices (walker, cane, wheelchair).  Does live independently.  Denies LOC, neck pain, abd pain.  Past Medical History:  Diagnosis Date  . COPD (chronic obstructive pulmonary disease) ([place])   . Lung mass   . Pleurisy 2008   left lung   Past Surgical History:  Procedure Laterality Date  . TUBAL LIGATION  1986   Social History   Social History  . Marital status: Divorced    Spouse name: N/A  . Number of children: N/A  . Years of education: N/A   Social History Main Topics  . Smoking status: Current Some Day Smoker    Types: Pipe  . Smokeless tobacco: Never Used     Comment: smoking 1/2 ppd (04/20/13) - smokes 1 pipe per day x66yr 12/18/14  . Alcohol use No  . Drug use: No  . Sexual activity: Not Asked   Other Topics Concern  . None   Social History Narrative  . None   Family History  Problem Relation Age of Onset  . Asthma    . Heart disease Mother   . Cancer Father   . Pancreatic cancer     Allergies  Allergen Reactions  . Albuterol Sulfate     tachycardia   Prior to Admission medications   Medication Sig Start Date End Date Taking? Authorizing Provider  aspirin 81 MG chewable tablet Chew 81 mg by mouth daily.  05/21/16  Yes Historical Provider, MD  atorvastatin (LIPITOR) 10 MG tablet Take 10 mg by mouth daily.  05/25/16  Yes Historical Provider, MD  [employer] VITAMIN B12 1000 MCG tablet Take 1,000 mcg by mouth daily. 05/24/16  Yes Historical Provider, MD  Cyanocobalamin (RA VITAMIN B-12 TR) 1000 MCG TBCR Take by mouth. 05/24/16  Yes Historical  Provider, MD  levalbuterol (XOPENEX HFA) 45 MCG/ACT inhaler INHALE 1-2 PUFFS INTO THE LUNGS EVERY 4 HRS AS NEEDED FOR WHEEZING 11/27/15  Yes MBrand Males MD  Tiotropium Bromide Monohydrate (SPIRIVA RESPIMAT) 2.5 MCG/ACT AERS INHALE 2 PUFFS INTO THE LUNGS DAILY. 03/27/16  Yes Historical Provider, MD  Vitamin D, Ergocalciferol, (DRISDOL) 50000 units CAPS capsule TAKE 50,000 UNITS BY MOUTH EVERY 7 DAYS. 05/25/16  Yes Historical Provider, MD  azithromycin (ZITHROMAX) 250 MG tablet Take 2 today, then 1 daily until gone. Patient not taking: Reported on 06/14/2016 06/14/16   RRigoberto Noel MD  BREO ELLIPTA 100-25 MCG/INH AEPB INHALE 1 PUFF INTO THE LUNGS DAILY. INS WILL PAY ON 03-24-15 Patient not taking: Reported on 06/14/2016 01/29/16   MBrand Males MD  doxycycline (VIBRA-TABS) 100 MG tablet Take 1 tablet (100 mg total) by mouth 2 (two) times daily. Patient not taking: Reported on 06/14/2016 09/22/15   MBrand Males MD  doxycycline (VIBRA-TABS) 100 MG tablet Take 1 po BID x 5 days; take after meals and avoid sunlight Patient not taking: Reported on 06/14/2016 12/02/15   MBrand Males MD  enoxaparin (LOVENOX) 40 MG/0.4ML injection Inject 0.4 mLs (40 mg total) into the skin daily. 06/15/16   Naiping MEphriam Jenkins MD  fluticasone furoate-vilanterol (BREO ELLIPTA) 100-25 MCG/INH AEPB Inhale 1 puff into the lungs daily. Patient not  taking: Reported on 06/14/2016 01/28/16   Brand Males, MD  HYDROcodone-acetaminophen (NORCO/VICODIN) 5-325 MG per tablet Take 1 tablet by mouth every 6 (six) hours as needed for severe pain. Patient not taking: Reported on 06/14/2016 07/21/15   Leo Grosser, MD  ibuprofen (ADVIL,MOTRIN) 200 MG tablet Take 200 mg by mouth every 6 (six) hours as needed for headache, mild pain or moderate pain.    Historical Provider, MD  oxyCODONE (OXY IR/ROXICODONE) 5 MG immediate release tablet Take 1-3 tablets (5-15 mg total) by mouth every 4 (four) hours as needed. 06/15/16   Naiping Ephriam Jenkins, MD    predniSONE (DELTASONE) 10 MG tablet Take 2 tabs daily X5 days, then 1 tab daily X5 days, then stop. Patient not taking: Reported on 06/14/2016 06/14/16   Rigoberto Noel, MD  Tiotropium Bromide Monohydrate (SPIRIVA RESPIMAT) 2.5 MCG/ACT AERS Inhale 2 puffs into the lungs daily. 01/28/16 06/02/16  Brand Males, MD   Dg Chest 1 View  Result Date: 06/14/2016 CLINICAL DATA:  Peri-op, hip fracture. Hx of COPD, lung mass. Current every day smoker. EXAM: CHEST 1 VIEW COMPARISON:  None. FINDINGS: Lungs are hyperinflated. There is perihilar peribronchial thickening. Biapical pleural parenchymal thickening is identified, right greater than left and appears chronic. No focal consolidations or pleural effusions. No pulmonary edema. IMPRESSION: Hyperinflation and bronchitic change. No evidence for acute pulmonary abnormality. Electronically Signed   By: Nolon Nations M.D.   On: 06/14/2016 19:12   Dg C-arm 1-60 Min  Result Date: 06/15/2016 CLINICAL DATA:  Lt femur IM nail intertrochanteric Fracture fluoro time 52mn 30 seconds EXAM: LEFT FEMUR PORTABLE 2 VIEWS; DG C-ARM 61-120 MIN COMPARISON:  06/14/2016 FINDINGS: Images demonstrate proximal and distal extent of IM nail fixation of left intertrochanteric fracture. Two lag screws traverse the femoral neck. No interval fractures are identified. No evidence for subluxation or dislocation. IMPRESSION: Status post ORIF of left intertrochanteric fracture Electronically Signed   By: ENolon NationsM.D.   On: 06/15/2016 08:32   Dg Hip Unilat With Pelvis 2-3 Views Left  Result Date: 06/14/2016 CLINICAL DATA:  Pt states she fell at home today onto left hip. Left hip pain since then. EXAM: DG HIP (WITH OR WITHOUT PELVIS) 2-3V LEFT COMPARISON:  None. FINDINGS: There is not intertrochanteric fracture of the left hip with little displacement or angulation. No dislocation or subluxation. IMPRESSION: Intertrochanteric fracture of the left hip. Electronically Signed   By:  ENolon NationsM.D.   On: 06/14/2016 18:00   Dg Femur Port Min 2 Views Left  Result Date: 06/15/2016 CLINICAL DATA:  Lt femur IM nail intertrochanteric Fracture fluoro time 164m 30 seconds EXAM: LEFT FEMUR PORTABLE 2 VIEWS; DG C-ARM 61-120 MIN COMPARISON:  06/14/2016 FINDINGS: Images demonstrate proximal and distal extent of IM nail fixation of left intertrochanteric fracture. Two lag screws traverse the femoral neck. No interval fractures are identified. No evidence for subluxation or dislocation. IMPRESSION: Status post ORIF of left intertrochanteric fracture Electronically Signed   By: ElNolon Nations.D.   On: 06/15/2016 08:32    All pertinent xrays, MRI, CT independently reviewed and interpreted  Positive ROS: All other systems have been reviewed and were otherwise negative with the exception of those mentioned in the HPI and as above.  Physical Exam: General: Alert, no acute distress Cardiovascular: No pedal edema Respiratory: No cyanosis, no use of accessory musculature GI: No organomegaly, abdomen is soft and non-tender Skin: No lesions in the area of chief complaint Neurologic: Sensation intact distally Psychiatric: Patient  is competent for consent with normal mood and affect Lymphatic: No axillary or cervical lymphadenopathy  MUSCULOSKELETAL:  - pain with movement of the hip and extremity - skin intact - NVI distally - compartments soft  Assessment: Left intertroch hip fracture  Plan: - surgery is recommended, patient and family are aware of r/b/a and wish to proceed - consent obtained - medical optimization per primary team - surgery is planned for Tuesday morning - Based on history and fracture pattern this likely represents a fragility fracture. - Fragility fractures affect up to one half of women and one third of men after age 61 years and occur in the setting of bone disorder such as osteoporosis or osteopenia and warrant appropriate work-up. - The following are  general recommendations that may serve as an outline for an appropriate work-up:  1.) Obtain bone density measurement to confirm presumptive diagnosis, assess severity of osteoporosis and risk of future fracture, and use as baseline for monitoring treatment  2.) Obtain laboratory tests: CBC, ESR, serum calcium, creatinine, albumin,phosphate, alkaline phosphatase, liver transaminases, protein electrophoresis, urinalysis, 25-hydroxyvitamin D.  3.) Exclude secondary causes of low bone mass and skeletal fragility (eg,multiple myeloma, lymphoma) as indicated.  4.) Obtain radiograph of thoracic and lumbar spine, particularly among individuals with back pain or height loss to assess presence of vertebral fractures  5.) Intermittent administration of recombinant human parathyroid hormone  6.) Optimize nutritional status using nutritional supplementation.  7.) Patient/family education to prevent future falls.  8.) Early mobilization and exercise program - exercise decreases the rate of bone loss and has been associated with decreased rate of fragility fractures   Thank you for the consult and the opportunity to see Ms. Sherron Ales, MD [place] 9:00 AM

## 2016-06-15 NOTE — Progress Notes (Signed)
Initial Nutrition Assessment  DOCUMENTATION CODES:   Not applicable  INTERVENTION:  Provide Ensure Enlive po BID, each supplement provides 350 kcal and 20 grams of protein.  Encourage adequate PO intake.   NUTRITION DIAGNOSIS:   Increased nutrient needs related to  (s/p surgery) as evidenced by estimated needs.  GOAL:   Patient will meet greater than or equal to 90% of their needs  MONITOR:   PO intake, Supplement acceptance, Labs, Weight trends, Skin, I & O's  REASON FOR ASSESSMENT:   Consult Hip fracture protocol  ASSESSMENT:   70 y.o. female with medical history significant for COPD and non-small cell lung cancer in clinical remission since October 2015, presenting to the emergency department with severe left hip pain and deformity after a ground-level mechanical fall just prior to arrival.   PROCEDURE (8/1): Treatment of intertrochanteric, fracture with intramedullary implant  Pt reports appetite is fine currently and PTA with usual consumption of at least 3 meals a day with an Ensure/Boost shake on occasion. No recent percent meal completion recorded. Weight has been stable per weight records. Pt is agreeable to Ensure to aid in caloric and protein needs. RD to order.   Nutrition-Focused physical exam completed. Findings are no fat depletion, moderate muscle depletion, and moderate edema.   Labs and medications reviewed.   Diet Order:  Diet regular Room service appropriate? Yes; Fluid consistency: Thin  Skin:   (Incision on L hip)  Last BM:  7/29  Height:   Ht Readings from Last 1 Encounters:  06/14/16 '5\' 8"'$  (1.727 m)    Weight:   Wt Readings from Last 1 Encounters:  06/14/16 130 lb (59 kg)    Ideal Body Weight:  63.6 kg  BMI:  Body mass index is 19.77 kg/m.  Estimated Nutritional Needs:   Kcal:  1600-1800  Protein:  70-80 grams  Fluid:  1.6 - 1.8 L/day  EDUCATION NEEDS:   No education needs identified at this time  Corrin Parker, MS,  RD, LDN Pager # [phone] After hours/ weekend pager # [phone]

## 2016-06-15 NOTE — Transfer of Care (Signed)
Immediate Anesthesia Transfer of Care Note  Patient: Karen Coffey  Procedure(s) Performed: Procedure(s): INTRAMEDULLARY (IM) NAIL INTERTROCHANTRIC FRACTURE LEFT (Left)  Patient Location: PACU  Anesthesia Type:General  Level of Consciousness: awake, oriented and patient cooperative  Airway & Oxygen Therapy: Patient Spontanous Breathing and Patient connected to nasal cannula oxygen  Post-op Assessment: Report given to RN and Post -op Vital signs reviewed and stable  Post vital signs: Reviewed  Last Vitals:  Vitals:   06/15/16 0558 06/15/16 0825  BP: 116/60 (!) (P) 144/56  Pulse: 72 (P) 76  Resp: 18 (P) 20  Temp: 36.4 C (P) 36.3 C    Last Pain:  Vitals:   06/15/16 0558  TempSrc: Oral  PainSc:          Complications: No apparent anesthesia complications

## 2016-06-15 NOTE — Progress Notes (Addendum)
PROGRESS NOTE  Karen Coffey  DJT:[id number] DOB: [birth year]  DOA: 06/14/2016 PCP: Maryruth Eve, MD   Brief Narrative:  70 year old female with PMH of COPD, non-small cell lung cancer and clinical remission since October 2015, presented to ED with severe left hip pain and deformity after a ground-level mechanical fall. Recently contacted her pulmonologist for COPD exacerbation and was prescribed a course of prednisone and azithromycin that she has not but respiratory status improved. Admitted for intertrochanteric fracture of left hip. Underwent surgical fixation by orthopedics on 06/15/16 at [facility]. DC to SNF when clinically improved.  Assessment & Plan:   Principal Problem:   Closed left hip fracture (HCC) Active Problems:   COPD exacerbation (HCC)   Normocytic anemia   Leukocytosis   Hip fracture (HCC)   Closed left intertrochanteric hip fracture - Sustained status post mechanical ground-level fall. - Orthopedics was consulted and patient underwent ORIF/intramedullary implant on 06/15/16 - As recommended by orthopedics, consider outpatient evaluation for osteoporosis including bone density, vitamin D deficiency etc. - As per orthopedics, weightbearing as tolerated, outpatient follow-up with Dr. Eduard Roux in 2 weeks for staple removal and currently on Lovenox for DVT prophylaxis (outpatient DVT prophylaxis to be clarified with orthopedics prior to discharge)  COPD - Reported recent flare but seemed to have resolved without initiating any treatment. - Currently stable. - Incentive spirometry.  Acute blood loss anemia complicating chronic anemia - Follow CBC daily and transfuse if hemoglobin <7 g per DL. - Anemia panel: ? Chronic disease Vs ?? Iron deficiency.  UTI - Start IV Rocephin after urine culture sent. Received perioperative cefazolin.   DVT prophylaxis: Lovenox Code Status: Full Family Communication: Discussed with patient. No family at bedside. Disposition Plan: DC  likely to SNF when clinically improved.   Consultants:   Orthopedics  Procedures:   ORIF/intramedullary implant of left hip fracture on 06/15/16  Foley catheter  Antimicrobials:   None    Subjective: Seen postoperatively. Complaints of some pain in the right hip. No dyspnea, cough or chest pain reported.  Objective:  Vitals:   06/15/16 0856 06/15/16 0911 06/15/16 0930 06/15/16 0946  BP: (!) 142/65 131/65 (!) 138/59 (!) 147/52  Pulse: 71 74  69  Resp: '17 20  18  '$ Temp:   97.6 F (36.4 C)   TempSrc:      SpO2: 99% 99%    Weight:      Height:        Intake/Output Summary (Last 24 hours) at 06/15/16 1412 Last data filed at 06/15/16 0830  Gross per 24 hour  Intake             1240 ml  Output              450 ml  Net              790 ml   Filed Weights   06/14/16 1931  Weight: 59 kg (130 lb)    Examination:  General exam: Pleasant elderly female lying comfortably propped up in bed. Respiratory system: Clear to auscultation. Respiratory effort normal. Cardiovascular system: S1 & S2 heard, RRR. No JVD, murmurs, rubs, gallops or clicks. No pedal edema. Gastrointestinal system: Abdomen is nondistended, soft and nontender. No organomegaly or masses felt. Normal bowel sounds heard. Central nervous system: Alert and oriented 2. No focal neurological deficits. Extremities: Symmetric 5 x 5 power. Left lower extremity movements limited secondary to postop pain. Left hip surgical site dressing clean and dry. Skin: No rashes, lesions or ulcers  Psychiatry: Judgement and insight appear normal. Mood & affect appropriate.     Data Reviewed: I have personally reviewed following labs and imaging studies  CBC:  Recent Labs Lab 06/14/16 1830 06/15/16 1135  WBC 11.5* 10.0  NEUTROABS 9.7*  --   HGB 11.6* 10.1*  HCT 34.6* 31.5*  MCV 89.6 91.8  PLT 221 096   Basic Metabolic Panel:  Recent Labs Lab 06/14/16 1830 06/15/16 1135  NA 135  --   K 4.1  --   CL 104  --     CO2 25  --   GLUCOSE 106*  --   BUN 9  --   CREATININE 0.86 0.91  CALCIUM 8.9  --    GFR: Estimated Creatinine Clearance: 52.8 mL/min (by C-G formula based on SCr of 0.91 mg/dL). Liver Function Tests: No results for input(s): AST, ALT, ALKPHOS, BILITOT, PROT, ALBUMIN in the last 168 hours. No results for input(s): LIPASE, AMYLASE in the last 168 hours. No results for input(s): AMMONIA in the last 168 hours. Coagulation Profile:  Recent Labs Lab 06/14/16 1830  INR 1.06   Cardiac Enzymes: No results for input(s): CKTOTAL, CKMB, CKMBINDEX, TROPONINI in the last 168 hours. BNP (last 3 results) No results for input(s): PROBNP in the last 8760 hours. HbA1C: No results for input(s): HGBA1C in the last 72 hours. CBG: No results for input(s): GLUCAP in the last 168 hours. Lipid Profile: No results for input(s): CHOL, HDL, LDLCALC, TRIG, CHOLHDL, LDLDIRECT in the last 72 hours. Thyroid Function Tests: No results for input(s): TSH, T4TOTAL, FREET4, T3FREE, THYROIDAB in the last 72 hours. Anemia Panel:  Recent Labs  06/14/16 2036  VITAMINB12 711  FERRITIN 54  TIBC 325  IRON 26*    Sepsis Labs: No results for input(s): PROCALCITON, LATICACIDVEN in the last 168 hours.  No results found for this or any previous visit (from the past 240 hour(s)).       Radiology Studies: Dg Chest 1 View  Result Date: 06/14/2016 CLINICAL DATA:  Peri-op, hip fracture. Hx of COPD, lung mass. Current every day smoker. EXAM: CHEST 1 VIEW COMPARISON:  None. FINDINGS: Lungs are hyperinflated. There is perihilar peribronchial thickening. Biapical pleural parenchymal thickening is identified, right greater than left and appears chronic. No focal consolidations or pleural effusions. No pulmonary edema. IMPRESSION: Hyperinflation and bronchitic change. No evidence for acute pulmonary abnormality. Electronically Signed   By: Nolon Nations M.D.   On: 06/14/2016 19:12   Dg C-arm 1-60 Min  Result  Date: 06/15/2016 CLINICAL DATA:  Lt femur IM nail intertrochanteric Fracture fluoro time 58mn 30 seconds EXAM: LEFT FEMUR PORTABLE 2 VIEWS; DG C-ARM 61-120 MIN COMPARISON:  06/14/2016 FINDINGS: Images demonstrate proximal and distal extent of IM nail fixation of left intertrochanteric fracture. Two lag screws traverse the femoral neck. No interval fractures are identified. No evidence for subluxation or dislocation. IMPRESSION: Status post ORIF of left intertrochanteric fracture Electronically Signed   By: ENolon NationsM.D.   On: 06/15/2016 08:32   Dg Hip Unilat With Pelvis 2-3 Views Left  Result Date: 06/14/2016 CLINICAL DATA:  Pt states she fell at home today onto left hip. Left hip pain since then. EXAM: DG HIP (WITH OR WITHOUT PELVIS) 2-3V LEFT COMPARISON:  None. FINDINGS: There is not intertrochanteric fracture of the left hip with little displacement or angulation. No dislocation or subluxation. IMPRESSION: Intertrochanteric fracture of the left hip. Electronically Signed   By: ENolon NationsM.D.   On: 06/14/2016 18:00  Dg Femur Port Min 2 Views Left  Result Date: 06/15/2016 CLINICAL DATA:  Lt femur IM nail intertrochanteric Fracture fluoro time 75mn 30 seconds EXAM: LEFT FEMUR PORTABLE 2 VIEWS; DG C-ARM 61-120 MIN COMPARISON:  06/14/2016 FINDINGS: Images demonstrate proximal and distal extent of IM nail fixation of left intertrochanteric fracture. Two lag screws traverse the femoral neck. No interval fractures are identified. No evidence for subluxation or dislocation. IMPRESSION: Status post ORIF of left intertrochanteric fracture Electronically Signed   By: ENolon NationsM.D.   On: 06/15/2016 08:32        Scheduled Meds: . antiseptic oral rinse  7 mL Mouth Rinse BID  . atorvastatin  10 mg Oral q1800  . ceFAZolin      .  ceFAZolin (ANCEF) IV  2 g Intravenous Q6H  . [START ON 06/16/2016] enoxaparin (LOVENOX) injection  40 mg Subcutaneous Q24H  . feeding supplement (ENSURE ENLIVE)   237 mL Oral BID BM  . senna  1 tablet Oral BID  . tiotropium  1 capsule Inhalation Daily  . cyanocobalamin  1,000 mcg Oral Daily   Continuous Infusions: . sodium chloride       LOS: 1 day    Time spent: 40 minutes.    [facility] MD [facility] Pager [phone] If 7PM-7AM, please contact night-coverage [email or web address] Password [email or web address] 06/15/2016, 2:12 PM

## 2016-06-15 NOTE — Op Note (Signed)
   Date of Surgery: 06/15/2016  INDICATIONS: Karen Coffey is a 70 y.o.-year-old female who sustained a left hip fracture. The risks and benefits of the procedure discussed with the patient prior to the procedure and all questions were answered; consent was obtained.  PREOPERATIVE DIAGNOSIS: left hip fracture   POSTOPERATIVE DIAGNOSIS: Same   PROCEDURE: Treatment of intertrochanteric, fracture with intramedullary implant. CPT [phone]   SURGEON: N. Eduard Roux, M.D.   ANESTHESIA: general   IV FLUIDS AND URINE: See anesthesia record   ESTIMATED BLOOD LOSS: 200 cc  IMPLANTS: [employer] InterTAN 10 x 40, 95/90  DRAINS: None.   COMPLICATIONS: None.   DESCRIPTION OF PROCEDURE: The patient was brought to the operating room and placed supine on the operating table. The patient's leg had been signed prior to the procedure. The patient had the anesthesia placed by the anesthesiologist. The prep verification and incision time-outs were performed to confirm that this was the correct patient, site, side and location. The patient had an SCD on the opposite lower extremity. The patient did receive antibiotics prior to the incision and was re-dosed during the procedure as needed at indicated intervals. The patient was positioned on the fracture table with the table in traction and internal rotation to reduce the hip. The well leg was placed in a scissor position and all bony prominences were well-padded. The patient had the lower extremity prepped and draped in the standard surgical fashion. The incision was made 4 finger breadths superior to the greater trochanter. A guide pin was inserted into the tip of the greater trochanter under fluoroscopic guidance. An opening reamer was used to gain access to the femoral canal. The nail length was measured and inserted down the femoral canal to its proper depth. The appropriate version of insertion for the lag screw was found under fluoroscopy. A pin was inserted up  the femoral neck through the jig. Then, a second antirotation pin was inserted inferior to the first pin. The length of the lag screw was then measured. The lag screw was inserted as near to center-center in the head as possible. The antirotation pin was then taken out and an interdigitating compression screw was placed in its place. The leg was taken out of traction, then the interdigitating compression screw was used to compress across the fracture. Compression was visualized on serial xrays. The wound was copiously irrigated with saline and the subcutaneous layer closed with 2.0 vicryl and the skin was reapproximated with staples. The wounds were cleaned and dried a final time and a sterile dressing was placed. The hip was taken through a range of motion at the end of the case under fluoroscopic imaging to visualize the approach-withdraw phenomenon and confirm implant length in the head. The patient was then awakened from anesthesia and taken to the recovery room in stable condition. All counts were correct at the end of the case.   POSTOPERATIVE PLAN: The patient will be weight bearing as tolerated and will return in 2 weeks for staple removal and the patient will receive DVT prophylaxis based on other medications, activity level, and risk ratio of bleeding to thrombosis.   Karen Cecil, MD [place] 8:05 AM

## 2016-06-15 NOTE — Discharge Instructions (Signed)
° ° °  1. Change dressings as needed °2. May shower but keep incisions covered and dry °3. Take lovenox to prevent blood clots °4. Take stool softeners as needed °5. Take pain meds as needed ° °

## 2016-06-16 ENCOUNTER — Encounter (HOSPITAL_COMMUNITY): Payer: Self-pay | Admitting: Orthopaedic Surgery

## 2016-06-16 DIAGNOSIS — S72002D Fracture of unspecified part of neck of left femur, subsequent encounter for closed fracture with routine healing: Secondary | ICD-10-CM

## 2016-06-16 DIAGNOSIS — D649 Anemia, unspecified: Secondary | ICD-10-CM

## 2016-06-16 LAB — BASIC METABOLIC PANEL
ANION GAP: 5 (ref 5–15)
BUN: 9 mg/dL (ref 6–20)
CHLORIDE: 103 mmol/L (ref 101–111)
CO2: 27 mmol/L (ref 22–32)
Calcium: 8.1 mg/dL — ABNORMAL LOW (ref 8.9–10.3)
Creatinine, Ser: 0.89 mg/dL (ref 0.44–1.00)
GFR calc Af Amer: >60 mL/min (ref >60)
GFR calc non Af Amer: >60 mL/min (ref >60)
GLUCOSE: 99 mg/dL (ref 65–99)
POTASSIUM: 3.7 mmol/L (ref 3.5–5.1)
Sodium: 135 mmol/L (ref 135–145)

## 2016-06-16 LAB — FOLATE RBC
FOLATE, RBC: 1296 ng/mL (ref >498)
Folate, Hemolysate: 433 ng/mL
Hematocrit: 33.4 % — ABNORMAL LOW (ref 34.0–46.6)

## 2016-06-16 LAB — CBC
HEMATOCRIT: 27.6 % — AB (ref 36.0–46.0)
HEMOGLOBIN: 8.9 g/dL — AB (ref 12.0–15.0)
MCH: 29.3 pg (ref 26.0–34.0)
MCHC: 32.2 g/dL (ref 30.0–36.0)
MCV: 90.8 fL (ref 78.0–100.0)
Platelets: 180 10*3/uL (ref 150–400)
RBC: 3.04 MIL/uL — AB (ref 3.87–5.11)
RDW: 13.5 % (ref 11.5–15.5)
WBC: 9.9 10*3/uL (ref 4.0–10.5)

## 2016-06-16 NOTE — Evaluation (Signed)
Physical Therapy Evaluation Patient Details Name: Karen Coffey MRN: [id number] DOB: Apr 05, 1946 Today's Date: 06/16/2016   History of Present Illness  Pt admitted with L hip fx after falling, s/p ORIF. PMH including but not limited to COPD, lung ca.  Clinical Impression  Prior to admission, pt was living alone and using a quad cane to assist with ambulation. Unsure of detailed living situation as therapist did not ask secondary to pt confusion, agitation and crying throughout session. Pt transitioned from supine to sitting EOB with total assist of two. She was able to sit EOB approximately 10 minutes, progressing from physical assistance for balance to sitting with min guard for balance. Pt with very low tolerance for bed mobility and therapeutic activities; therefore, transfer to the recliner chair was deferred at this time. Pt demonstrated confusion, agitation and crying throughout session. No family or caregivers present at bedside to determine baseline cognition.   Pt would continue to benefit from skilled physical therapy services at this time while admitted and after d/c to address her below listed limitations in order to improve her overall safety and independence with functional mobility. PT recommending pt d/c to SNF.      Follow Up Recommendations SNF    Equipment Recommendations  None recommended by PT    Recommendations for Other Services       Precautions / Restrictions Precautions Precautions: Fall Restrictions Weight Bearing Restrictions: Yes LLE Weight Bearing: Weight bearing as tolerated      Mobility  Bed Mobility Overal bed mobility: +2 for physical assistance;Needs Assistance Bed Mobility: Supine to Sit;Sit to Supine Rolling: +2 for physical assistance;Total assist   Supine to sit: +2 for safety/equipment;Total assist Sit to supine: +2 for safety/equipment;Total assist   General bed mobility comments: assisted for all aspects using bed pad  Transfers                  General transfer comment: did not attempt as pt did not tolerate sitting EOB very well, and was agitated, confused and crying intermittently throughout session  Ambulation/Gait                Stairs            Wheelchair Mobility    Modified Rankin (Stroke Patients Only)       Balance Overall balance assessment: Needs assistance Sitting-balance support: Feet supported;Single extremity supported Sitting balance-Leahy Scale: Poor Sitting balance - Comments: posterior and R side lean                                     Pertinent Vitals/Pain Pain Assessment: Faces Faces Pain Scale: Hurts worst Pain Location: L hip with movement Pain Descriptors / Indicators: Guarding;Grimacing;Moaning;Crying Pain Intervention(s): Monitored during session;Limited activity within patient's tolerance;Repositioned    Home Living Family/patient expects to be discharged to:: Private residence Living Arrangements: Alone             Home Equipment: Cane - quad Additional Comments: Pt with intermittent agitation and crying, did not ask about home situation.    Prior Function Level of Independence: Independent with assistive device(s)         Comments: walked with quad cane, not 02 dependent at home     Hand Dominance   Dominant Hand: Right    Extremity/Trunk Assessment   Upper Extremity Assessment: Defer to OT evaluation           Lower  Extremity Assessment: LLE deficits/detail   LLE Deficits / Details: Pt with decreased strength and ROM limitations secondary to post-op.     Communication   Communication: No difficulties  Cognition Arousal/Alertness: Awake/alert Behavior During Therapy: Anxious;Agitated Overall Cognitive Status: Impaired/Different from baseline (based on EMR; no family/caregiver present to verify) Area of Impairment: Orientation;Following commands;Problem solving;Memory Orientation Level: Disoriented  to;Time;Place;Situation   Memory: Decreased short-term memory Following Commands: Follows one step commands with increased time;Follows one step commands inconsistently     Problem Solving: Slow processing;Decreased initiation;Difficulty sequencing;Requires verbal cues;Requires tactile cues General Comments: pt with increased guarding when assisted to move L LE or perform bed mobility    General Comments      Exercises        Assessment/Plan    PT Assessment Patient needs continued PT services  PT Diagnosis Acute pain;Difficulty walking   PT Problem List Decreased strength;Decreased range of motion;Decreased activity tolerance;Decreased balance;Decreased mobility;Decreased coordination;Decreased cognition;Decreased knowledge of use of DME;Pain  PT Treatment Interventions DME instruction;Gait training;Functional mobility training;Therapeutic activities;Therapeutic exercise;Balance training;Patient/family education   PT Goals (Current goals can be found in the Care Plan section) Acute Rehab PT Goals Patient Stated Goal: to be left alone PT Goal Formulation: With patient Time For Goal Achievement: 06/23/16 Potential to Achieve Goals: Fair    Frequency Min 3X/week   Barriers to discharge        Co-evaluation PT/OT/SLP Co-Evaluation/Treatment: Yes Reason for Co-Treatment: For patient/therapist safety;Necessary to address cognition/behavior during functional activity PT goals addressed during session: Mobility/safety with mobility;Balance;Strengthening/ROM OT goals addressed during session: ADL's and self-care       End of Session Equipment Utilized During Treatment: Oxygen Activity Tolerance: Patient limited by pain;Treatment limited secondary to agitation Patient left: in bed;with call bell/phone within reach;Other (comment) (nurse present in room at end of session) Nurse Communication: Mobility status         Time: 7824-2353 PT Time Calculation (min) (ACUTE ONLY):  39 min   Charges:   PT Evaluation $PT Eval Moderate Complexity: 1 Procedure PT Treatments $Therapeutic Activity: 8-22 mins   PT G CodesClearnce Sorrel Kingstin Heims 06/16/2016, 3:57 PM Sherie Don, [place], DPT [phone]

## 2016-06-16 NOTE — Progress Notes (Signed)
Pt unable to void after several attempts on bedpan.  Bladder scan performed and 475cc noted.  I & O cath done with 500cc out.  Pt states she feels better. Will continue to monitor.

## 2016-06-16 NOTE — Progress Notes (Signed)
PROGRESS NOTE  Karen Coffey  MEQ:[id number] DOB: Nov 11, 1946  DOA: 06/14/2016 PCP: Maryruth Eve, MD   Brief Narrative:  70 year old female with PMH of COPD, non-small cell lung cancer and clinical remission since October 2015, presented to ED with severe left hip pain and deformity after a ground-level mechanical fall. Recently contacted her pulmonologist for COPD exacerbation and was prescribed a course of prednisone and azithromycin that she has not but respiratory status improved. Admitted for intertrochanteric fracture of left hip. Underwent surgical fixation by orthopedics on 06/15/16 at [facility]. DC to SNF when clinically improved.  Assessment & Plan:   Principal Problem:   Closed left hip fracture (HCC) Active Problems:   COPD exacerbation (HCC)   Normocytic anemia   Leukocytosis   Hip fracture (HCC)   Closed left intertrochanteric hip fracture - Sustained status post mechanical ground-level fall. - Orthopedics was consulted and patient underwent ORIF/intramedullary implant on 06/15/16 - As recommended by orthopedics, consider outpatient evaluation for osteoporosis including bone density, vitamin D deficiency etc. - As per orthopedics, weightbearing as tolerated, outpatient follow-up with Dr. Eduard Roux in 2 weeks for staple removal and currently on Lovenox for DVT prophylaxis (outpatient DVT prophylaxis to be clarified with orthopedics prior to discharge) - pain well controlled.  PT eval recommending SNF placement.   COPD - Reported recent flare but seemed to have resolved without initiating any treatment. - Currently stable. - Incentive spirometry. - on 2 lit of [place] oxygen.   Acute blood loss anemia complicating chronic/ iron deficiency anemia - baseline hemoglobin  Is around 12 , dropped to 8.9/  - Follow CBC daily and transfuse if hemoglobin <7 g per DL. - iron supplementation added.  - recommend outpatient work up for iron deficiency anemia  UTI Unfortunately urine  cultures not sent. Currently on rocephin. Plan for 5 days of treatment with antibiotics. .   DVT prophylaxis: Lovenox Code Status: Full Family Communication: Discussed with patient. No family at bedside. Disposition Plan: DC likely to SNF when clinically improved.   Consultants:   Orthopedics  Procedures:   ORIF/intramedullary implant of left hip fracture on 06/15/16   Antimicrobials:   None    Subjective: On [place] oxygen, denies sob or chest pain. Pain is well controlled.   Objective:  Vitals:   06/16/16 0239 06/16/16 0539 06/16/16 1300 06/16/16 1500  BP: 122/60 (!) 134/37  (!) 144/62  Pulse: 81 88  89  Resp: '16 16  18  '$ Temp: 99.4 F (37.4 C) 98.4 F (36.9 C)  98.7 F (37.1 C)  TempSrc: Oral Oral    SpO2: 94% 96% 97% 96%  Weight:      Height:        Intake/Output Summary (Last 24 hours) at 06/16/16 1853 Last data filed at 06/16/16 1510  Gross per 24 hour  Intake             1855 ml  Output             1200 ml  Net              655 ml   Filed Weights   06/14/16 1931  Weight: 59 kg (130 lb)    Examination:  General exam: Pleasant elderly female lying comfortably propped up in bed. No distress.  Respiratory system: Clear to auscultation. Respiratory effort normal. No wheezing or rhonchi.  Cardiovascular system: S1 & S2 heard, RRR. No JVD, murmurs, rubs, gallops or clicks. No pedal edema. Gastrointestinal system: Abdomen is nondistended, soft and nontender.  No organomegaly or masses felt. Normal bowel sounds heard. Central nervous system: Alert and oriented 2. No focal neurological deficits. Extremities: Symmetric 5 x 5 power. Left lower extremity movements limited secondary to postop pain. Left hip surgical site dressing clean and dry. Skin: No rashes, lesions or ulcers     Data Reviewed: I have personally reviewed following labs and imaging studies  CBC:  Recent Labs Lab 06/14/16 1830 06/14/16 2036 06/15/16 1135 06/16/16 0611  WBC 11.5*  --   10.0 9.9  NEUTROABS 9.7*  --   --   --   HGB 11.6*  --  10.1* 8.9*  HCT 34.6* 33.4* 31.5* 27.6*  MCV 89.6  --  91.8 90.8  PLT 221  --  185 694   Basic Metabolic Panel:  Recent Labs Lab 06/14/16 1830 06/15/16 1135 06/16/16 0611  NA 135  --  135  K 4.1  --  3.7  CL 104  --  103  CO2 25  --  27  GLUCOSE 106*  --  99  BUN 9  --  9  CREATININE 0.86 0.91 0.89  CALCIUM 8.9  --  8.1*   GFR: Estimated Creatinine Clearance: 54 mL/min (by C-G formula based on SCr of 0.89 mg/dL). Liver Function Tests: No results for input(s): AST, ALT, ALKPHOS, BILITOT, PROT, ALBUMIN in the last 168 hours. No results for input(s): LIPASE, AMYLASE in the last 168 hours. No results for input(s): AMMONIA in the last 168 hours. Coagulation Profile:  Recent Labs Lab 06/14/16 1830  INR 1.06   Cardiac Enzymes: No results for input(s): CKTOTAL, CKMB, CKMBINDEX, TROPONINI in the last 168 hours. BNP (last 3 results) No results for input(s): PROBNP in the last 8760 hours. HbA1C: No results for input(s): HGBA1C in the last 72 hours. CBG: No results for input(s): GLUCAP in the last 168 hours. Lipid Profile: No results for input(s): CHOL, HDL, LDLCALC, TRIG, CHOLHDL, LDLDIRECT in the last 72 hours. Thyroid Function Tests: No results for input(s): TSH, T4TOTAL, FREET4, T3FREE, THYROIDAB in the last 72 hours. Anemia Panel:  Recent Labs  06/14/16 2036  VITAMINB12 711  FERRITIN 54  TIBC 325  IRON 26*    Sepsis Labs: No results for input(s): PROCALCITON, LATICACIDVEN in the last 168 hours.  No results found for this or any previous visit (from the past 240 hour(s)).       Radiology Studies: Dg Chest 1 View  Result Date: 06/14/2016 CLINICAL DATA:  Peri-op, hip fracture. Hx of COPD, lung mass. Current every day smoker. EXAM: CHEST 1 VIEW COMPARISON:  None. FINDINGS: Lungs are hyperinflated. There is perihilar peribronchial thickening. Biapical pleural parenchymal thickening is identified, right  greater than left and appears chronic. No focal consolidations or pleural effusions. No pulmonary edema. IMPRESSION: Hyperinflation and bronchitic change. No evidence for acute pulmonary abnormality. Electronically Signed   By: Nolon Nations M.D.   On: 06/14/2016 19:12   Dg C-arm 1-60 Min  Result Date: 06/15/2016 CLINICAL DATA:  Lt femur IM nail intertrochanteric Fracture fluoro time 82mn 30 seconds EXAM: LEFT FEMUR PORTABLE 2 VIEWS; DG C-ARM 61-120 MIN COMPARISON:  06/14/2016 FINDINGS: Images demonstrate proximal and distal extent of IM nail fixation of left intertrochanteric fracture. Two lag screws traverse the femoral neck. No interval fractures are identified. No evidence for subluxation or dislocation. IMPRESSION: Status post ORIF of left intertrochanteric fracture Electronically Signed   By: ENolon NationsM.D.   On: 06/15/2016 08:32   Dg Femur Port Min 2 Views Left  Result Date: 06/15/2016 CLINICAL DATA:  Lt femur IM nail intertrochanteric Fracture fluoro time 35mn 30 seconds EXAM: LEFT FEMUR PORTABLE 2 VIEWS; DG C-ARM 61-120 MIN COMPARISON:  06/14/2016 FINDINGS: Images demonstrate proximal and distal extent of IM nail fixation of left intertrochanteric fracture. Two lag screws traverse the femoral neck. No interval fractures are identified. No evidence for subluxation or dislocation. IMPRESSION: Status post ORIF of left intertrochanteric fracture Electronically Signed   By: ENolon NationsM.D.   On: 06/15/2016 08:32        Scheduled Meds: . antiseptic oral rinse  7 mL Mouth Rinse BID  . atorvastatin  10 mg Oral q1800  . cefTRIAXone (ROCEPHIN)  IV  1 g Intravenous Q24H  . enoxaparin (LOVENOX) injection  40 mg Subcutaneous Q24H  . feeding supplement (ENSURE ENLIVE)  237 mL Oral BID BM  . senna  1 tablet Oral BID  . tiotropium  1 capsule Inhalation Daily  . cyanocobalamin  1,000 mcg Oral Daily   Continuous Infusions: . sodium chloride 125 mL/hr at 06/16/16 1038     LOS: 2 days     Time spent: 30 minutes.    AHosie Poisson MD [facility] Pager [phone] If 7PM-7AM, please contact night-coverage [email or web address] Password [facility]8/12/2015, 6:53 PM

## 2016-06-16 NOTE — Progress Notes (Signed)
RT was called by RN to give patient a breathing treatment. When RT arrived, patient stated she did not need a breathing treatment. She stated she did not feel short of breath and did not take them at home, only inhalers. All patient vitals within normal limits, with clear/diminished breath sounds and patient was in no distress. RT will continue to monitor and will administer treatment upon patient request.

## 2016-06-16 NOTE — Evaluation (Signed)
Occupational Therapy Evaluation Patient Details Name: Karen Coffey MRN: [id number] DOB: [birth year] Today's Date: 06/16/2016    History of Present Illness Pt admitted with L hip fx after falling, s/p ORIF. PMH: COPD, lung ca.   Clinical Impression   Pt was living alone and ambulated with a quad cane. Limited session today due to increased pain and anxiety with movement. Sat EOB x approximately 10 minutes. Pt required 2 person total assist for bed level mobility. She demonstrated poor sitting balance. Transfer to chair deferred due to decreased activity tolerance. Pt demonstrating confusion and difficulty following commands. No family available to determine baseline cognition. Recommending SNF for continued therapy.    Follow Up Recommendations  SNF;Supervision/Assistance - 24 hour    Equipment Recommendations   (defer to next venue)    Recommendations for Other Services       Precautions / Restrictions Precautions Precautions: Fall Restrictions Weight Bearing Restrictions: Yes LLE Weight Bearing: Weight bearing as tolerated      Mobility Bed Mobility Overal bed mobility: +2 for physical assistance;Needs Assistance Bed Mobility: Rolling;Supine to Sit;Sit to Supine Rolling: +2 for physical assistance;Total assist   Supine to sit: +2 for safety/equipment;Total assist Sit to supine: +2 for safety/equipment;Total assist   General bed mobility comments: assisted for all aspects using bed pad  Transfers                 General transfer comment: did not attempt, poor activity tolerance    Balance Overall balance assessment: Needs assistance Sitting-balance support: Feet supported;Single extremity supported Sitting balance-Leahy Scale: Poor Sitting balance - Comments: posterior and R side lean                                    ADL Overall ADL's : Needs assistance/impaired Eating/Feeding: Set up;Bed level Eating/Feeding Details (indicate cue type  and reason): pt falling asleep with cup in her hand Grooming: Wash/dry face;Brushing hair;Sitting;Moderate assistance;Maximal assistance Grooming Details (indicate cue type and reason): decreased task persistence Upper Body Bathing: Total assistance;Sitting   Lower Body Bathing: Total assistance;Bed level   Upper Body Dressing : Moderate assistance;Bed level Upper Body Dressing Details (indicate cue type and reason): changed soiled gown Lower Body Dressing: Total assistance;Bed level     Toilet Transfer Details (indicate cue type and reason): 2 person assist for bedpan placement Toileting- Clothing Manipulation and Hygiene: Total assistance;+2 for physical assistance;Bed level               Vision     Perception     Praxis      Pertinent Vitals/Pain Pain Assessment: Faces Faces Pain Scale: Hurts worst Pain Location: L hip with movement Pain Descriptors / Indicators: Guarding;Grimacing;Moaning;Crying Pain Intervention(s): Limited activity within patient's tolerance;Monitored during session;Repositioned;Ice applied     Hand Dominance Right   Extremity/Trunk Assessment Upper Extremity Assessment Upper Extremity Assessment: Overall WFL for tasks assessed   Lower Extremity Assessment Lower Extremity Assessment: Defer to PT evaluation       Communication Communication Communication: No difficulties   Cognition Arousal/Alertness: Awake/alert Behavior During Therapy: Anxious;Agitated Overall Cognitive Status: Impaired/Different from baseline Area of Impairment: Orientation;Following commands;Problem solving;Memory Orientation Level: Disoriented to;Time;Place (recalled she had a hip fx, but does not remember fall)   Memory: Decreased short-term memory Following Commands: Follows one step commands with increased time;Follows one step commands inconsistently (and multimodal cues)     Problem Solving: Slow processing;Decreased initiation;Difficulty sequencing;Requires  verbal  cues;Requires tactile cues General Comments: pt with increased guarding when assisted to move L LE or perform bed mobility   General Comments       Exercises       Shoulder Instructions      Home Living Family/patient expects to be discharged to:: Private residence Living Arrangements: [place] - quad   Additional Comments: Pt with intermittent agitation and crying, did not ask about home situation.      Prior Functioning/Environment Level of Independence: Independent with assistive device(s)        Comments: walked with quad cane, not 02 dependent at home    OT Diagnosis: Generalized weakness;Cognitive deficits;Acute pain   OT Problem List: Decreased strength;Decreased activity tolerance;Impaired balance (sitting and/or standing);Decreased cognition;Decreased safety awareness;Decreased knowledge of use of DME or AE;Pain   OT Treatment/Interventions: Self-care/ADL training;DME and/or AE instruction;Therapeutic activities;Cognitive remediation/compensation;Patient/family education;Balance training    OT Goals(Current goals can be found in the care plan section) Acute Rehab OT Goals Patient Stated Goal: to be left alone OT Goal Formulation: Patient unable to participate in goal setting Time For Goal Achievement: 06/30/16 Potential to Achieve Goals: Fair ADL Goals Pt Will Perform Grooming: with min guard assist;sitting Pt Will Perform Upper Body Dressing: with min guard assist;sitting Pt Will Transfer to Toilet: stand pivot transfer;bedside commode;with mod assist Additional ADL Goal #1: Pt will perform bed mobility with min assist in preparation for ADL at EOB. Additional ADL Goal #2: Pt will sit EOB with min guard assist x 10 minutes while engaged in ADL. Additional ADL Goal #3: Pt will be oriented to place, time and situation using environmental cues.  OT Frequency: Min 2X/week   Barriers to D/C:             Co-evaluation PT/OT/SLP Co-Evaluation/Treatment: Yes Reason for Co-Treatment: For patient/therapist safety;Necessary to address cognition/behavior during functional activity   OT goals addressed during session: ADL's and self-care      End of Session Equipment Utilized During Treatment: Oxygen (3L) Nurse Communication: Mobility status (anxiety, pt request for breathing treatment)  Activity Tolerance: Patient limited by pain;Treatment limited secondary to agitation Patient left: in bed;with call bell/phone within reach;with nursing/sitter in room   Time: 1312-1405 OT Time Calculation (min): 53 min Charges:  OT General Charges $OT Visit: 1 Procedure OT Evaluation $OT Eval Moderate Complexity: 1 Procedure OT Treatments $Self Care/Home Management : 8-22 mins G-Codes:    Malka So 06/16/2016, 2:26 PM  [phone]

## 2016-06-16 NOTE — Progress Notes (Signed)
Patient's daughter Glenard Haring) received message from the day staff informing her that her mother was in the hospital and has come to visit.  She is interested to speak with care team in the morning to figure out the best way to assist her mother once she is discharged.

## 2016-06-16 NOTE — Progress Notes (Signed)
   Subjective:  Patient reports pain as severe.  Very anxious about any movement of he left leg.  Objective:   VITALS:   Vitals:   06/16/16 0539 06/16/16 1300 06/16/16 1500 06/16/16 2029  BP: (!) 134/37  (!) 144/62 115/79  Pulse: 88  89 (!) 148  Resp: '16  18 20  '$ Temp: 98.4 F (36.9 C)  98.7 F (37.1 C) 98.4 F (36.9 C)  TempSrc: Oral   Oral  SpO2: 96% 97% 96% 92%  Weight:      Height:        Neurologically intact Neurovascular intact Sensation intact distally Intact pulses distally Dorsiflexion/Plantar flexion intact Incision: dressing C/D/I and no drainage No cellulitis present Compartment soft   Lab Results  Component Value Date   WBC 9.9 06/16/2016   HGB 8.9 (L) 06/16/2016   HCT 27.6 (L) 06/16/2016   MCV 90.8 06/16/2016   PLT 180 06/16/2016     Assessment/Plan:  1 Day Post-Op   - Expected postop acute blood loss anemia - will monitor for symptoms - Up with PT/OT - DVT ppx - SCDs, ambulation, lovenox - WBAT operative extremity - Pain control - Discharge planning - likely needs SNF  Marianna Payment 06/16/2016, 9:43 PM [phone]

## 2016-06-17 DIAGNOSIS — D72829 Elevated white blood cell count, unspecified: Secondary | ICD-10-CM

## 2016-06-17 LAB — CBC
HCT: 26.4 % — ABNORMAL LOW (ref 36.0–46.0)
Hemoglobin: 8.6 g/dL — ABNORMAL LOW (ref 12.0–15.0)
MCH: 29.3 pg (ref 26.0–34.0)
MCHC: 32.6 g/dL (ref 30.0–36.0)
MCV: 89.8 fL (ref 78.0–100.0)
PLATELETS: 200 10*3/uL (ref 150–400)
RBC: 2.94 MIL/uL — AB (ref 3.87–5.11)
RDW: 13.5 % (ref 11.5–15.5)
WBC: 12.5 10*3/uL — ABNORMAL HIGH (ref 4.0–10.5)

## 2016-06-17 LAB — BASIC METABOLIC PANEL
Anion gap: 11 (ref 5–15)
BUN: 8 mg/dL (ref 6–20)
CO2: 21 mmol/L — AB (ref 22–32)
Calcium: 7.8 mg/dL — ABNORMAL LOW (ref 8.9–10.3)
Chloride: 100 mmol/L — ABNORMAL LOW (ref 101–111)
Creatinine, Ser: 0.91 mg/dL (ref 0.44–1.00)
GFR calc Af Amer: >60 mL/min (ref >60)
GLUCOSE: 108 mg/dL — AB (ref 65–99)
POTASSIUM: 3.5 mmol/L (ref 3.5–5.1)
Sodium: 132 mmol/L — ABNORMAL LOW (ref 135–145)

## 2016-06-17 NOTE — Anesthesia Postprocedure Evaluation (Signed)
Anesthesia Post Note  Patient: Karen Coffey  Procedure(s) Performed: Procedure(s) (LRB): INTRAMEDULLARY (IM) NAIL INTERTROCHANTRIC FRACTURE LEFT (Left)  Patient location during evaluation: PACU Anesthesia Type: General Level of consciousness: sedated Pain management: satisfactory to patient Vital Signs Assessment: post-procedure vital signs reviewed and stable Respiratory status: spontaneous breathing Cardiovascular status: stable Anesthetic complications: no    Last Vitals:  Vitals:   06/17/16 0423 06/17/16 0651  BP: 114/64   Pulse: (!) 140 74  Resp: 18   Temp: 36.8 C     Last Pain:  Vitals:   06/17/16 0759  TempSrc:   PainSc: 10-Worst pain ever                 Riccardo Dubin

## 2016-06-17 NOTE — Progress Notes (Signed)
   Subjective:  Patient slow with PT yesterday. Confusion worse  Objective:   VITALS:   Vitals:   06/16/16 2029 06/16/16 2300 06/17/16 0423 06/17/16 0651  BP: 115/79  114/64   Pulse: (!) 148 (!) 143 (!) 140 74  Resp: 20  18   Temp: 98.4 F (36.9 C)  98.3 F (36.8 C)   TempSrc: Oral  Oral   SpO2: 92%  96%   Weight:      Height:        Neurologically intact Neurovascular intact Sensation intact distally Intact pulses distally Dorsiflexion/Plantar flexion intact Incision: dressing C/D/I and no drainage No cellulitis present Compartment soft   Lab Results  Component Value Date   WBC 12.5 (H) 06/17/2016   HGB 8.6 (L) 06/17/2016   HCT 26.4 (L) 06/17/2016   MCV 89.8 06/17/2016   PLT 200 06/17/2016     Assessment/Plan:  2 Days Post-Op   - Expected postop acute blood loss anemia - will monitor for symptoms - Up with PT/OT - DVT ppx - SCDs, ambulation, lovenox - WBAT operative extremity - Pain control - will need SNF  Marianna Payment 06/17/2016, 7:41 AM [phone]

## 2016-06-17 NOTE — Progress Notes (Addendum)
Patient's daughter just reported that her mother stated that she has seen two bugs on the wall that was not actually seen by daughter.  Nurse suggested this was possibly from the Oxy IR her mother received for pain at 2250 last night.  Also suggested to use Tylenol PO next time for pain.  Patient's daughter does not think it is from the Oxy and would like her mother to continue to receive Oxy for pain.

## 2016-06-17 NOTE — Clinical Social Work Note (Signed)
Clinical Social Work Assessment  Patient Details  Name: Karen Coffey MRN: [id number] Date of Birth: 09-02-1946  Date of referral:  06/17/16               Reason for consult:  Facility Placement                Permission sought to share information with:   (Facilities) Permission granted to share information::   (Facilities)  Name::        Agency::     Relationship::     Contact Information:     Housing/Transportation Living arrangements for the past 2 months:  Single Family Home Source of Information:  Patient Patient Interpreter Needed:  None Criminal Activity/Legal Involvement Pertinent to Current Situation/Hospitalization:  No - Comment as needed Significant Relationships:  Adult Children Lives with:  Self Do you feel safe going back to the place where you live?  Yes Need for family participation in patient care:  Yes (Comment) (Patient states her daughter is her primary support and her POA.)  Care giving concerns:  There are no care giving concerns at this time. Patient states that she completes her ADL's independently. Patient states that that she is not interested in facility and she prefers home health.   Social Worker assessment / plan:  SW met with patient at bedside. Patient was alert and oriented. There was no family present.  Patient states that she lives at home alone in [place]. Patient states that prior to coming to [facility] she has been completing her ADL's independently. Patient states that her daughter is her primary support.  Patient states that she is not interested in facility and that she would prefer home health.  Patient informed SW that she has fallen x2 within the past 6 months.  Daughter/ Karen Coffey  Employment status:  Retired Forensic scientist:   Production designer, theatre/television/film.) PT Recommendations:  [place] / Referral to community resources:   (SW spoke with pt about facilities. However, the pt states that she is not  interested in SNF.)  Patient/Family's Response to care:  Patient is appropriate at this time.  Patient/Family's Understanding of and Emotional Response to Diagnosis, Current Treatment, and Prognosis:  Patient states that she has no questions.  Emotional Assessment Appearance:  Appears stated age Attitude/Demeanor/Rapport:   (Appropriate.) Affect (typically observed):  Appropriate Orientation:  Oriented to  Time, Oriented to Situation, Oriented to Place, Oriented to Self Alcohol / Substance use:  Not Applicable Psych involvement (Current and /or in the community):  No (Comment)  Discharge Needs  Concerns to be addressed:  Adjustment to Illness Readmission within the last 30 days:  No Current discharge risk:  None Barriers to Discharge:  No Barriers Identified   Tilda Burrow R 06/17/2016, 2:18 PM

## 2016-06-17 NOTE — Care Management Important Message (Signed)
Important Message  Patient Details  Name: Karen Coffey MRN: [id number] Date of Birth: [birth year]   Medicare Important Message Given:  Yes    Loann Quill 06/17/2016, 10:56 AM

## 2016-06-17 NOTE — Progress Notes (Signed)
SW completed FL2 for patient. Also, she obtained PASRR number.  PASRR: [id number]  Tilda Burrow, Social Worker

## 2016-06-17 NOTE — Care Management Note (Signed)
Case Management Note  Patient Details  Name: Karen Coffey MRN: [id number] Date of Birth: 1945-11-17  Subjective/Objective:  70 yr old female s/p IM nailing of left hip fracture.                 PLAN: Case manager spoke with patient concerning discharge plans. Patient states she will not have asistance at home and she knows she needs shortterm rehab. CM notified Education officer, museum.  Expected Discharge Date:   (unknown)               Expected Discharge Plan:  Skilled Nursing Facility  In-House Referral:  Clinical Social Work  Discharge planning Services  CM Consult  Post Acute Care Choice:    Choice offered to:  Patient  DME Arranged:  N/A DME Agency:     HH Arranged:  NA HH Agency:  NA  Status of Service:  Completed, signed off  If discussed at [place] of Stay Meetings, dates discussed:    Additional Comments:  Ninfa Meeker, RN 06/17/2016, 2:40 PM

## 2016-06-17 NOTE — Progress Notes (Signed)
PROGRESS NOTE  Karen Coffey  ZOX:[id number] DOB: Oct 28, 1946  DOA: 06/14/2016 PCP: Maryruth Eve, MD   Brief Narrative:  70 year old female with PMH of COPD, non-small cell lung cancer and clinical remission since October 2015, presented to ED with severe left hip pain and deformity after a ground-level mechanical fall. Recently contacted her pulmonologist for COPD exacerbation and was prescribed a course of prednisone and azithromycin that she has not but respiratory status improved. Admitted for intertrochanteric fracture of left hip. Underwent surgical fixation by orthopedics on 06/15/16 at [facility]. DC to SNF when clinically improved.  Assessment & Plan:   Principal Problem:   Closed left hip fracture (HCC) Active Problems:   COPD exacerbation (HCC)   Normocytic anemia   Leukocytosis   Hip fracture (HCC)   Closed left intertrochanteric hip fracture - Sustained status post mechanical ground-level fall. - Orthopedics was consulted and patient underwent ORIF/intramedullary implant on 06/15/16 - As recommended by orthopedics, consider outpatient evaluation for osteoporosis including bone density, vitamin D deficiency etc. - As per orthopedics, weightbearing as tolerated, outpatient follow-up with Dr. Eduard Roux in 2 weeks for staple removal and currently on Lovenox for DVT prophylaxis (outpatient DVT prophylaxis to be clarified with orthopedics prior to discharge) - pain well controlled.  PT eval recommending SNF placement.  - no new recommendations.   COPD - Reported recent flare but seemed to have resolved without initiating any treatment. - Currently stable. - Incentive spirometry. - she reports she is on 3 lit of [place] oxygen at home, but doesn't use it regularly, but apparently the daughter reported that patient doesn't use oxygen. Called duaghter to verify, but could not reach her, so left a message on her phone.  - here we were not able to wean he roff the oxygen.  Continue with 3 lit  , she denies any cough and there is no wheezing heard.   Acute blood loss anemia complicating chronic/ iron deficiency anemia - baseline hemoglobin  Is around 12 , dropped to 8.6.  - Follow CBC daily and transfuse if hemoglobin <7 g per DL. - iron supplementation added.  - recommend outpatient work up for iron deficiency anemia  UTI Unfortunately urine cultures not sent. Currently on rocephin. Plan for 5 days of treatment with antibiotics. . Today is day 4, plan to d/c antibiotics on discharge.    DVT prophylaxis: Lovenox Code Status: Full Family Communication: Discussed with patient. No family at bedside. Called daughter and left a message on her phone.  Disposition Plan: DC likely to SNF when clinically improved probably tomorrow.    Consultants:   Orthopedics  Procedures:   ORIF/intramedullary implant of left hip fracture on 06/15/16   Antimicrobials:   None    Subjective: Reports she is on 3 lit of [place] oxygen at home. Denies any new complaints.  Is open to going to rehab.    Objective:  Vitals:   06/16/16 2300 06/17/16 0423 06/17/16 0651 06/17/16 1213  BP:  114/64  (!) 127/56  Pulse: (!) 143 (!) 140 74 95  Resp:  18  18  Temp:  98.3 F (36.8 C)  98.3 F (36.8 C)  TempSrc:  Oral    SpO2:  96%  95%  Weight:      Height:        Intake/Output Summary (Last 24 hours) at 06/17/16 1548 Last data filed at 06/17/16 0900  Gross per 24 hour  Intake             3055 ml  Output                0 ml  Net             3055 ml   Filed Weights   06/14/16 1931  Weight: 59 kg (130 lb)    Examination:  General exam: Pleasant elderly female lying comfortably propped up in bed. No distress. On 3 lit of [place] oxygen.  Respiratory system: Clear to auscultation. Respiratory effort normal. No wheezing or rhonchi.  Cardiovascular system: S1 & S2 heard, RRR. No JVD, murmurs, rubs, gallops or clicks. No pedal edema. Gastrointestinal system: Abdomen is nondistended, soft and  nontender. No organomegaly or masses felt. Normal bowel sounds heard. Central nervous system: Alert and oriented 2. No focal neurological deficits. Extremities: Symmetric 5 x 5 power. Left lower extremity movements limited secondary to postop pain. Left hip surgical site dressing clean and dry. Able to bear weight.  Skin: No rashes, lesions or ulcers     Data Reviewed: I have personally reviewed following labs and imaging studies  CBC:  Recent Labs Lab 06/14/16 1830 06/14/16 2036 06/15/16 1135 06/16/16 0611 06/17/16 0428  WBC 11.5*  --  10.0 9.9 12.5*  NEUTROABS 9.7*  --   --   --   --   HGB 11.6*  --  10.1* 8.9* 8.6*  HCT 34.6* 33.4* 31.5* 27.6* 26.4*  MCV 89.6  --  91.8 90.8 89.8  PLT 221  --  185 180 144   Basic Metabolic Panel:  Recent Labs Lab 06/14/16 1830 06/15/16 1135 06/16/16 0611 06/17/16 0428  NA 135  --  135 132*  K 4.1  --  3.7 3.5  CL 104  --  103 100*  CO2 25  --  27 21*  GLUCOSE 106*  --  99 108*  BUN 9  --  9 8  CREATININE 0.86 0.91 0.89 0.91  CALCIUM 8.9  --  8.1* 7.8*   GFR: Estimated Creatinine Clearance: 52.8 mL/min (by C-G formula based on SCr of 0.91 mg/dL). Liver Function Tests: No results for input(s): AST, ALT, ALKPHOS, BILITOT, PROT, ALBUMIN in the last 168 hours. No results for input(s): LIPASE, AMYLASE in the last 168 hours. No results for input(s): AMMONIA in the last 168 hours. Coagulation Profile:  Recent Labs Lab 06/14/16 1830  INR 1.06   Cardiac Enzymes: No results for input(s): CKTOTAL, CKMB, CKMBINDEX, TROPONINI in the last 168 hours. BNP (last 3 results) No results for input(s): PROBNP in the last 8760 hours. HbA1C: No results for input(s): HGBA1C in the last 72 hours. CBG: No results for input(s): GLUCAP in the last 168 hours. Lipid Profile: No results for input(s): CHOL, HDL, LDLCALC, TRIG, CHOLHDL, LDLDIRECT in the last 72 hours. Thyroid Function Tests: No results for input(s): TSH, T4TOTAL, FREET4, T3FREE,  THYROIDAB in the last 72 hours. Anemia Panel:  Recent Labs  06/14/16 2036  VITAMINB12 711  FERRITIN 54  TIBC 325  IRON 26*    Sepsis Labs: No results for input(s): PROCALCITON, LATICACIDVEN in the last 168 hours.  No results found for this or any previous visit (from the past 240 hour(s)).       Radiology Studies: No results found.      Scheduled Meds: . antiseptic oral rinse  7 mL Mouth Rinse BID  . atorvastatin  10 mg Oral q1800  . cefTRIAXone (ROCEPHIN)  IV  1 g Intravenous Q24H  . enoxaparin (LOVENOX) injection  40 mg Subcutaneous Q24H  . feeding  supplement (ENSURE ENLIVE)  237 mL Oral BID BM  . senna  1 tablet Oral BID  . tiotropium  1 capsule Inhalation Daily  . cyanocobalamin  1,000 mcg Oral Daily   Continuous Infusions: . sodium chloride 125 mL/hr at 06/16/16 1038     LOS: 3 days    Time spent: 30 minutes.    Hosie Poisson, MD [facility] Pager [phone]  If 7PM-7AM, please contact night-coverage [email or web address] Password [facility] 06/17/2016, 3:48 PM

## 2016-06-17 NOTE — Progress Notes (Signed)
Patient was having pain with movement while getting cleaned up; nurse offered Vicodin PO for pain since patient had success with the Vicodin before.  Patient's daughter agreed.  Two Vicodin PO was given for pain at 0433 crushed in applesauce.

## 2016-06-17 NOTE — Progress Notes (Signed)
Physical Therapy Treatment Patient Details Name: Karen Coffey MRN: [id number] DOB: 1946/06/25 Today's Date: 06/17/2016    History of Present Illness Pt admitted with L hip fx after falling, s/p ORIF. PMH including but not limited to COPD, lung ca.    PT Comments    Pt performed sit to stand in stedy frame x 4 trials from various seat heights.  Pt required max assist +2 and presents with decreased weight bearing to LLE.  Pt will remain to require Skilled Nursing at d/c to improve strength and promote functional independence before return home.     Follow Up Recommendations  SNF     Equipment Recommendations  None recommended by PT    Recommendations for Other Services       Precautions / Restrictions Precautions Precautions: Fall Restrictions Weight Bearing Restrictions: Yes LLE Weight Bearing: Weight bearing as tolerated    Mobility  Bed Mobility Overal bed mobility: Needs Assistance Bed Mobility: Supine to Sit     Supine to sit: Max assist     General bed mobility comments: Assist with B LEs to edge of bed.  Pt able to follow commands for hand placement to assist with trunk elevation.    Transfers Overall transfer level: Needs assistance   Transfers: Sit to/from Stand Sit to Stand: Max assist;+2 physical assistance;+2 safety/equipment         General transfer comment: Pt performed sit to stand from edge of bed x1, BSC x1, and stedy seat x2.  Pt required cues for hand placement on stedy frame, eccentric loasding, knee extension and hip extension.  Pt noted standing in L plantar flexion with L knee extension.    Ambulation/Gait Ambulation/Gait assistance:  (unable, required max +2 for standing in frame.  )               Stairs            Wheelchair Mobility    Modified Rankin (Stroke Patients Only)       Balance Overall balance assessment: Needs assistance Sitting-balance support: Feet supported;Single extremity supported Sitting  balance-Leahy Scale: Poor Sitting balance - Comments: posterior lean improves with use of stedy frame in front to correct weight shifting, kyphotic posturing noted in seated position.       Standing balance-Leahy Scale: Zero                      Cognition Arousal/Alertness: Awake/alert Behavior During Therapy: Anxious Overall Cognitive Status: Within Functional Limits for tasks assessed                      Exercises      General Comments        Pertinent Vitals/Pain Pain Assessment: Faces Faces Pain Scale: Hurts even more Pain Location: L hip with movement Pain Descriptors / Indicators: Grimacing;Guarding;Moaning    Home Living                      Prior Function            PT Goals (current goals can now be found in the care plan section) Acute Rehab PT Goals Patient Stated Goal: to get back to walking Potential to Achieve Goals: Fair Progress towards PT goals: Progressing toward goals    Frequency  Min 3X/week    PT Plan Current plan remains appropriate    Co-evaluation             End of Session  Equipment Utilized During Treatment: Oxygen Activity Tolerance: Patient limited by fatigue;Patient limited by pain Patient left: in chair;with call bell/phone within reach;with chair alarm set     Time: 8159-4707 PT Time Calculation (min) (ACUTE ONLY): 30 min  Charges:  $Therapeutic Activity: 23-37 mins                    G Codes:      Cristela Blue 07-09-16, 5:05 PM  Governor [place], PTA pager [phone]

## 2016-06-17 NOTE — Progress Notes (Signed)
MD, patient's daughter is requesting the PRN respiratory treatments to be scheduled on the [facility], since her mother may not request treatments when needed.    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  The daughter is concerned that when she has to work (at [employer]), she will not be able to check on her mother here.

## 2016-06-17 NOTE — NC FL2 (Signed)
[place] LEVEL OF CARE SCREENING TOOL     IDENTIFICATION  Patient Name: Karen Coffey Birthdate: 1946-09-18 Sex: female Admission Date (Current Location): 06/14/2016  [facility] and [place] Number:  Herbalist and Address:  [facility], [place] [address] [place], [place] [address]      Provider Number: [id number]  Attending Physician Name and Address:  Hosie Poisson, MD  Relative Name and Phone Number:       Current Level of Care: Hospital Recommended Level of Care: Other (Comment), [place] Prior Approval Number:    Date Approved/Denied:   PASRR Number:    Discharge Plan: Other (Comment)    Current Diagnoses: Patient Active Problem List   Diagnosis Date Noted  . Closed left hip fracture ([place]) 06/14/2016  . Normocytic anemia 06/14/2016  . Leukocytosis 06/14/2016  . Hip fracture ([place]) 06/14/2016  . Chronic obstructive pulmonary disease ([place])   . COPD exacerbation ([place]) 02/25/2015  . COPD, moderate ([place]) 12/18/2014  . COPD, severe ([place]) 03/26/2013  . Lung mass 11/02/2011  . Dyspnea 11/02/2011  . Cough 11/02/2011  . Tachycardia 11/02/2011  . Smoking 11/02/2011  . Cachexia ([place]) 11/02/2011  . Chest pain, musculoskeletal 11/02/2011    Orientation RESPIRATION BLADDER Height & Weight     Self, Time, Situation, Place  Normal Continent Weight: 130 lb (59 kg) Height:  '5\' 8"'$  (172.7 cm)  BEHAVIORAL SYMPTOMS/MOOD NEUROLOGICAL BOWEL NUTRITION STATUS      Continent    AMBULATORY STATUS COMMUNICATION OF NEEDS Skin    (Patient states that she can complete ADL's independdently. However, she may need moderate assistance.) Verbally Normal                       Personal Care Assistance Level of Assistance  Bathing           Functional Limitations Info             SPECIAL CARE FACTORS FREQUENCY                       Contractures      Additional Factors Info                  Current  Medications (06/17/2016):  This is the current hospital active medication list Current Facility-Administered Medications  Medication Dose Route Frequency Provider Last Rate Last Dose  . 0.9 %  sodium chloride infusion   Intravenous Continuous Leandrew Koyanagi, MD 125 mL/hr at 06/16/16 1038    . acetaminophen (TYLENOL) tablet 650 mg  650 mg Oral Q6H PRN Naiping Ephriam Jenkins, MD       Or  . acetaminophen (TYLENOL) suppository 650 mg  650 mg Rectal Q6H PRN Naiping Ephriam Jenkins, MD      . alum & mag hydroxide-simeth (MAALOX/MYLANTA) 200-200-20 MG/5ML suspension 30 mL  30 mL Oral Q4H PRN Naiping Ephriam Jenkins, MD      . antiseptic oral rinse (CPC / CETYLPYRIDINIUM CHLORIDE 0.05%) solution 7 mL  7 mL Mouth Rinse BID Modena Jansky, MD   7 mL at 06/17/16 1000  . atorvastatin (LIPITOR) tablet 10 mg  10 mg Oral q1800 Vianne Bulls, MD   10 mg at 06/16/16 1834  . bisacodyl (DULCOLAX) EC tablet 5 mg  5 mg Oral Daily PRN Ilene Qua Opyd, MD      . cefTRIAXone (ROCEPHIN) 1 g in dextrose 5 % 50 mL IVPB  1 g Intravenous Q24H Modena Jansky, MD   1 g at 06/16/16 2019  . enoxaparin (LOVENOX) injection 40 mg  40 mg Subcutaneous Q24H Naiping Ephriam Jenkins, MD   40 mg at 06/17/16 0748  . feeding supplement (ENSURE ENLIVE) (ENSURE ENLIVE) liquid 237 mL  237 mL Oral BID BM Modena Jansky, MD   237 mL at 06/17/16 1251  . HYDROcodone-acetaminophen (NORCO/VICODIN) 5-325 MG per tablet 1-2 tablet  1-2 tablet Oral Q6H PRN Vianne Bulls, MD   1 tablet at 06/17/16 1248  . ipratropium (ATROVENT) nebulizer solution 0.5 mg  0.5 mg Nebulization Q4H PRN Vianne Bulls, MD      . levalbuterol (XOPENEX) nebulizer solution 1.25 mg  1.25 mg Nebulization Q4H PRN Vianne Bulls, MD      . menthol-cetylpyridinium (CEPACOL) lozenge 3 mg  1 lozenge Oral PRN Naiping Ephriam Jenkins, MD       Or  . phenol (CHLORASEPTIC) mouth spray 1 spray  1 spray Mouth/Throat PRN Naiping Ephriam Jenkins, MD      . metoCLOPramide (REGLAN) tablet 5-10 mg  5-10 mg Oral Q8H PRN Naiping Ephriam Jenkins, MD       Or  .  metoCLOPramide (REGLAN) injection 5-10 mg  5-10 mg Intravenous Q8H PRN Naiping Ephriam Jenkins, MD      . metoprolol (LOPRESSOR) injection 5 mg  5 mg Intravenous Q4H PRN Vianne Bulls, MD      . morphine 2 MG/ML injection 0.5 mg  0.5 mg Intravenous Q2H PRN Vianne Bulls, MD   0.5 mg at 06/17/16 0747  . ondansetron (ZOFRAN) tablet 4 mg  4 mg Oral Q6H PRN Naiping Ephriam Jenkins, MD       Or  . ondansetron [facility]) injection 4 mg  4 mg Intravenous Q6H PRN Leandrew Koyanagi, MD   4 mg at 06/17/16 0853  . oxyCODONE (Oxy IR/ROXICODONE) immediate release tablet 5-10 mg  5-10 mg Oral Q4H PRN Leandrew Koyanagi, MD   10 mg at 06/16/16 2251  . polyethylene glycol (MIRALAX / GLYCOLAX) packet 17 g  17 g Oral Daily PRN Vianne Bulls, MD      . senna (SENOKOT) tablet 8.6 mg  1 tablet Oral BID Vianne Bulls, MD   8.6 mg at 06/17/16 1015  . tiotropium (SPIRIVA) inhalation capsule 18 mcg  1 capsule Inhalation Daily Vianne Bulls, MD   18 mcg at 06/17/16 1433  . vitamin B-12 (CYANOCOBALAMIN) tablet 1,000 mcg  1,000 mcg Oral Daily Vianne Bulls, MD   1,000 mcg at 06/17/16 1015     Discharge Medications: Please see discharge summary for a list of discharge medications.  Relevant Imaging Results:  Relevant Lab Results:   Additional Information    Venetia Maxon, [place] R

## 2016-06-17 NOTE — Progress Notes (Signed)
Patient's HR will jump up to the 140's after being moved and in pain but when she is calm and not being moved HR will be 80's to 90's.

## 2016-06-18 ENCOUNTER — Inpatient Hospital Stay (HOSPITAL_COMMUNITY): Payer: Medicare Other

## 2016-06-18 LAB — BASIC METABOLIC PANEL
ANION GAP: 6 (ref 5–15)
BUN: 6 mg/dL (ref 6–20)
CHLORIDE: 103 mmol/L (ref 101–111)
CO2: 28 mmol/L (ref 22–32)
Calcium: 7.8 mg/dL — ABNORMAL LOW (ref 8.9–10.3)
Creatinine, Ser: 0.73 mg/dL (ref 0.44–1.00)
GFR calc Af Amer: >60 mL/min (ref >60)
GLUCOSE: 110 mg/dL — AB (ref 65–99)
POTASSIUM: 3.1 mmol/L — AB (ref 3.5–5.1)
SODIUM: 137 mmol/L (ref 135–145)

## 2016-06-18 LAB — CBC
HEMATOCRIT: 22.1 % — AB (ref 36.0–46.0)
Hemoglobin: 7.3 g/dL — ABNORMAL LOW (ref 12.0–15.0)
MCH: 29.7 pg (ref 26.0–34.0)
MCHC: 33 g/dL (ref 30.0–36.0)
MCV: 89.8 fL (ref 78.0–100.0)
PLATELETS: 190 10*3/uL (ref 150–400)
RBC: 2.46 MIL/uL — ABNORMAL LOW (ref 3.87–5.11)
RDW: 13.8 % (ref 11.5–15.5)
WBC: 8.2 10*3/uL (ref 4.0–10.5)

## 2016-06-18 LAB — TSH: TSH: 1.386 u[IU]/mL (ref 0.350–4.500)

## 2016-06-18 LAB — ABO/RH: ABO/RH(D): O POS

## 2016-06-18 LAB — PREPARE RBC (CROSSMATCH)

## 2016-06-18 MED ORDER — SODIUM CHLORIDE 0.9 % IV SOLN
Freq: Once | INTRAVENOUS | Status: DC
Start: 2016-06-18 — End: 2016-06-19

## 2016-06-18 MED ORDER — IOPAMIDOL (ISOVUE-300) INJECTION 61%
INTRAVENOUS | Status: AC
  Administered 2016-06-18: 75 mL
  Filled 2016-06-18: qty 75

## 2016-06-18 NOTE — Progress Notes (Signed)
Physical Therapy Treatment Patient Details Name: Karen Coffey MRN: [id number] DOB: 09-Oct-1946 Today's Date: 06/18/2016    History of Present Illness Pt admitted with L hip fx after falling, s/p ORIF. PMH including but not limited to COPD, lung ca.    PT Comments    Pt presented sitting upright in recliner with nurse and daughter present. PT was requested to assist with transfer to bedside commode. Pt performed STS with Stedy frame x2 with max assist of two. Pt demonstrated decreased weight shift towards her L LE in standing. Pt would continue to benefit from skilled physical therapy services at this time while admitted and after d/c to address her limitations in order to improve her overall safety and independence with functional mobility.    Follow Up Recommendations  SNF     Equipment Recommendations  None recommended by PT    Recommendations for Other Services       Precautions / Restrictions Precautions Precautions: Fall Restrictions Weight Bearing Restrictions: Yes LLE Weight Bearing: Weight bearing as tolerated    Mobility  Bed Mobility Overal bed mobility: Needs Assistance;+2 for physical assistance Bed Mobility: Supine to Sit     Supine to sit: +2 for physical assistance;Total assist     General bed mobility comments: pt sitting OOB in recliner when PT entered room  Transfers Overall transfer level: Needs assistance Equipment used:  (stedy) Transfers: Sit to/from Stand Sit to Stand: Max assist;+2 physical assistance;+2 safety/equipment         General transfer comment: assisted at hips with bed pad or towel from stedy, assist to weight shift to move flaps   Ambulation/Gait                 Stairs            Wheelchair Mobility    Modified Rankin (Stroke Patients Only)       Balance Overall balance assessment: Needs assistance Sitting-balance support: Feet supported Sitting balance-Leahy Scale: Poor Sitting balance - Comments:  posterior lean improves with use of stedy frame in front to correct weight shifting, kyphotic posturing noted in seated position.       Standing balance-Leahy Scale: Zero                      Cognition Arousal/Alertness: Awake/alert Behavior During Therapy: Anxious Overall Cognitive Status: Impaired/Different from baseline Area of Impairment: Memory;Following commands     Memory: Decreased short-term memory Following Commands: Follows one step commands with increased time     Problem Solving: Slow processing;Decreased initiation;Difficulty sequencing;Requires verbal cues;Requires tactile cues General Comments: pt guarding with sit to stand and assist to weight shift in stedy    Exercises      General Comments        Pertinent Vitals/Pain Pain Assessment: Faces Faces Pain Scale: Hurts even more Pain Location: L hip Pain Descriptors / Indicators: Grimacing;Guarding;Moaning Pain Intervention(s): Monitored during session;Repositioned    Home Living                      Prior Function            PT Goals (current goals can now be found in the care plan section) Acute Rehab PT Goals Patient Stated Goal: to get back to walking PT Goal Formulation: With patient Time For Goal Achievement: 06/23/16 Potential to Achieve Goals: Fair Progress towards PT goals: Progressing toward goals    Frequency  Min 3X/week    PT Plan Current  plan remains appropriate    Co-evaluation PT/OT/SLP Co-Evaluation/Treatment: Yes Reason for Co-Treatment: For patient/therapist safety;Necessary to address cognition/behavior during functional activity PT goals addressed during session: Mobility/safety with mobility;Balance;Proper use of DME OT goals addressed during session: ADL's and self-care     End of Session Equipment Utilized During Treatment: Oxygen Activity Tolerance: Patient limited by fatigue;Patient limited by pain Patient left: in chair;with call bell/phone  within reach;with chair alarm set;with family/visitor present     Time: 1438-8875 PT Time Calculation (min) (ACUTE ONLY): 35 min  Charges:  $Therapeutic Activity: 8-22 mins                    G CodesClearnce Sorrel Kimbree Casanas June 25, 2016, 12:04 PM Sherie Don, [place], DPT [phone]

## 2016-06-18 NOTE — Progress Notes (Signed)
Physical Therapy Treatment Patient Details Name: Karen Coffey MRN: [id number] DOB: [birth year] Today's Date: 06/18/2016    History of Present Illness Pt admitted with L hip fx after falling, s/p ORIF. PMH including but not limited to COPD, lung ca.    PT Comments    Pt performed better carry over with standing but remains to require total assist with bed mobility.  Pt more motivated and following commands better.  Plan remains for skilled rehab at d/c.    Follow Up Recommendations  SNF     Equipment Recommendations  None recommended by PT    Recommendations for Other Services       Precautions / Restrictions Precautions Precautions: Fall Restrictions Weight Bearing Restrictions: Yes LLE Weight Bearing: Weight bearing as tolerated    Mobility  Bed Mobility Overal bed mobility: Needs Assistance;+2 for physical assistance Bed Mobility: Supine to Sit     Supine to sit: +2 for physical assistance;Total assist     General bed mobility comments: Assist to advance to edge of bed with B LEs, OT assisted paitents trunk into sitting.  pt remains to push posterior and required cues for forward weight shifting and pursed lip breathing.    Transfers Overall transfer level: Needs assistance Equipment used:  (stedy) Transfers: Sit to/from Stand Sit to Stand: +2 physical assistance;+2 safety/equipment;Mod assist         General transfer comment: Pt required mod assist +2 from elevated bed and stedy seat.  Pt required cues for weight shifting to place stedy flaps and encourage weight shifting to LLE.  Pt required repeated standing trials to improve standing tolerance.  Pt putting more weight on LLE than yesterday.  Pt fatigued post standing trials.    Ambulation/Gait Ambulation/Gait assistance:  (remains unable.  )               Stairs            Wheelchair Mobility    Modified Rankin (Stroke Patients Only)       Balance Overall balance assessment: Needs  assistance Sitting-balance support: Feet supported Sitting balance-Leahy Scale: Poor Sitting balance - Comments: posterior lean improves with use of stedy frame in front to correct weight shifting, kyphotic posturing noted in seated position.       Standing balance-Leahy Scale: Zero                      Cognition Arousal/Alertness: Awake/alert Behavior During Therapy: Anxious Overall Cognitive Status: Impaired/Different from baseline Area of Impairment: Memory;Following commands Orientation Level: Disoriented to;Time;Place;Situation   Memory: Decreased short-term memory Following Commands: Follows one step commands with increased time     Problem Solving: Slow processing;Decreased initiation;Difficulty sequencing;Requires verbal cues;Requires tactile cues General Comments: pt guarding with sit to stand and assist to weight shift in stedy    Exercises      General Comments        Pertinent Vitals/Pain Pain Assessment: Faces Faces Pain Scale: Hurts even more Pain Location: L hip Pain Descriptors / Indicators: Guarding;Grimacing;Discomfort;Moaning Pain Intervention(s): Monitored during session;Repositioned    Home Living                      Prior Function            PT Goals (current goals can now be found in the care plan section) Acute Rehab PT Goals Patient Stated Goal: to get back to walking PT Goal Formulation: With patient Time For Goal Achievement: 06/23/16 Potential  to Achieve Goals: Fair Progress towards PT goals: Progressing toward goals    Frequency  Min 3X/week    PT Plan Current plan remains appropriate    Co-evaluation PT/OT/SLP Co-Evaluation/Treatment: Yes Reason for Co-Treatment: Complexity of the patient's impairments (multi-system involvement);For patient/therapist safety PT goals addressed during session: Mobility/safety with mobility;Balance;Proper use of DME OT goals addressed during session: ADL's and self-care      End of Session Equipment Utilized During Treatment: Oxygen Activity Tolerance: Patient limited by fatigue;Patient limited by pain Patient left: in chair;with call bell/phone within reach;with chair alarm set;with family/visitor present     Time: 1610-9604 PT Time Calculation (min) (ACUTE ONLY): 39 min  Charges:  $Therapeutic Activity: 8-22 mins                    G Codes:      Cristela Blue 2016/06/20, 12:40 PM  Governor Rooks, PTA pager [phone]

## 2016-06-18 NOTE — Progress Notes (Signed)
PROGRESS NOTE  Karen Coffey  GEZ:[id number] DOB: 1946/09/12  DOA: 06/14/2016 PCP: Maryruth Eve, MD   Brief Narrative:  70 year old female with PMH of COPD, non-small cell lung cancer and clinical remission since October 2015, presented to ED with severe left hip pain and deformity after a ground-level mechanical fall. Recently contacted her pulmonologist for COPD exacerbation and was prescribed a course of prednisone and azithromycin that she has not but respiratory status improved. Admitted for intertrochanteric fracture of left hip. Underwent surgical fixation by orthopedics on 06/15/16 at [facility]. DC to SNF when clinically improved.  Assessment & Plan:   Principal Problem:   Closed left hip fracture (HCC) Active Problems:   COPD exacerbation (HCC)   Normocytic anemia   Leukocytosis   Hip fracture (HCC)   Closed left intertrochanteric hip fracture - Sustained status post mechanical ground-level fall. - Orthopedics was consulted and patient underwent ORIF/intramedullary implant on 06/15/16 - As recommended by orthopedics, consider outpatient evaluation for osteoporosis including bone density, vitamin D deficiency etc. - As per orthopedics, weightbearing as tolerated, outpatient follow-up with Dr. Eduard Roux in 2 weeks for staple removal and currently on Lovenox for DVT prophylaxis (outpatient DVT prophylaxis to be clarified with orthopedics prior to discharge) - pain well controlled.  PT eval recommending SNF placement.  - no new recommendations.   COPD - Reported recent flare but seemed to have resolved without initiating any treatment. - Currently stable. - Incentive spirometry. - she reports she is on 3 lit of [place] oxygen at home, but doesn't use it regularly, but apparently the daughter reported that patient doesn't use oxygen. - here we were not able to wean he roff the oxygen.  Continue with 3 lit , she denies any cough and there is no wheezing heard.  - repeat CXR ordered and it  was abnormal, ordered CT chest with contrast to follow up.   Acute blood loss anemia complicating chronic/ iron deficiency anemia - baseline hemoglobin  Is around 12 , dropped to 7.3 - 1 unit of prbc ordered and repeat hemoglobin for tomorrow.  - iron supplementation added.  - recommend outpatient work up for iron deficiency anemia  UTI Completed 5 days of IV rocephin. By 8/4.   Tachycardia on ambulation:  Persistent, .  Telemetry monitor overnight.  Get 12 LEAD ekg and echocardiogram.     DVT prophylaxis: Lovenox Code Status: Full Family Communication: discussed the plan of care with the daughter over the phone and the patient.  Disposition Plan: DC likely to SNF when clinically improved probably tomorrow.    Consultants:   Orthopedics  Procedures:   ORIF/intramedullary implant of left hip fracture on 06/15/16   Antimicrobials:   None    Subjective: Reports she is on 3 lit of [place] oxygen at home. Denies any new complaints.  Is open to going to rehab.    Objective:  Vitals:   06/18/16 0929 06/18/16 1428 06/18/16 1458 06/18/16 1730  BP: 129/73 139/78 139/76 (!) 122/55  Pulse: 95 (!) 150 (!) 145 91  Resp: '18 20 20 18  '$ Temp: 98 F (36.7 C) 98.5 F (36.9 C) 98.6 F (37 C) 97.4 F (36.3 C)  TempSrc: Oral Oral Oral Axillary  SpO2: 99% 98% 100% 100%  Weight:      Height:        Intake/Output Summary (Last 24 hours) at 06/18/16 1847 Last data filed at 06/18/16 1812  Gross per 24 hour  Intake  1090 ml  Output                0 ml  Net             1090 ml   Filed Weights   06/14/16 1931  Weight: 59 kg (130 lb)    Examination:  General exam: Pleasant elderly female lying comfortably propped up in bed. No distress. On 3 lit of [place] oxygen.  Respiratory system: Clear to auscultation. Respiratory effort normal. No wheezing or rhonchi.  Cardiovascular system: S1 & S2 heard, tachycaridc. No JVD, murmurs, rubs, gallops or clicks. No pedal  edema. Gastrointestinal system: Abdomen is nondistended, soft and nontender. No organomegaly or masses felt. Normal bowel sounds heard. Central nervous system: Alert and oriented 2. No focal neurological deficits. Extremities: Symmetric 5 x 5 power. Left lower extremity movements limited secondary to postop pain. Left hip surgical site dressing clean and dry. Able to bear weight.  Skin: No rashes, lesions or ulcers     Data Reviewed: I have personally reviewed following labs and imaging studies  CBC:  Recent Labs Lab 06/14/16 1830 06/14/16 2036 06/15/16 1135 06/16/16 0611 06/17/16 0428 06/18/16 0316  WBC 11.5*  --  10.0 9.9 12.5* 8.2  NEUTROABS 9.7*  --   --   --   --   --   HGB 11.6*  --  10.1* 8.9* 8.6* 7.3*  HCT 34.6* 33.4* 31.5* 27.6* 26.4* 22.1*  MCV 89.6  --  91.8 90.8 89.8 89.8  PLT 221  --  185 180 200 409   Basic Metabolic Panel:  Recent Labs Lab 06/14/16 1830 06/15/16 1135 06/16/16 0611 06/17/16 0428  NA 135  --  135 132*  K 4.1  --  3.7 3.5  CL 104  --  103 100*  CO2 25  --  27 21*  GLUCOSE 106*  --  99 108*  BUN 9  --  9 8  CREATININE 0.86 0.91 0.89 0.91  CALCIUM 8.9  --  8.1* 7.8*   GFR: Estimated Creatinine Clearance: 52.8 mL/min (by C-G formula based on SCr of 0.91 mg/dL). Liver Function Tests: No results for input(s): AST, ALT, ALKPHOS, BILITOT, PROT, ALBUMIN in the last 168 hours. No results for input(s): LIPASE, AMYLASE in the last 168 hours. No results for input(s): AMMONIA in the last 168 hours. Coagulation Profile:  Recent Labs Lab 06/14/16 1830  INR 1.06   Cardiac Enzymes: No results for input(s): CKTOTAL, CKMB, CKMBINDEX, TROPONINI in the last 168 hours. BNP (last 3 results) No results for input(s): PROBNP in the last 8760 hours. HbA1C: No results for input(s): HGBA1C in the last 72 hours. CBG: No results for input(s): GLUCAP in the last 168 hours. Lipid Profile: No results for input(s): CHOL, HDL, LDLCALC, TRIG, CHOLHDL,  LDLDIRECT in the last 72 hours. Thyroid Function Tests:  Recent Labs  06/18/16 1207  TSH 1.386   Anemia Panel: No results for input(s): VITAMINB12, FOLATE, FERRITIN, TIBC, IRON, RETICCTPCT in the last 72 hours.  Sepsis Labs: No results for input(s): PROCALCITON, LATICACIDVEN in the last 168 hours.  No results found for this or any previous visit (from the past 240 hour(s)).       Radiology Studies: Dg Chest Port 1 View  Result Date: 06/18/2016 CLINICAL DATA:  Hypoxia EXAM: PORTABLE CHEST 1 VIEW COMPARISON:  06/14/2016 FINDINGS: The cardiac shadow is within normal limits. The lungs are well aerated bilaterally. A nodular density is noted in the right mid lung which was  not as well appreciated on the prior exam. This may be related to confluence of parenchymal shadows. No other focal abnormality is noted. IMPRESSION: Nodular appearing density in the right lung. This was not well appreciated on the recent exam and may be artifactual in nature. CT is recommended for are further evaluation. Electronically Signed   By: Inez Catalina M.D.   On: 06/18/2016 13:44        Scheduled Meds: . sodium chloride   Intravenous Once  . antiseptic oral rinse  7 mL Mouth Rinse BID  . atorvastatin  10 mg Oral q1800  . cefTRIAXone (ROCEPHIN)  IV  1 g Intravenous Q24H  . enoxaparin (LOVENOX) injection  40 mg Subcutaneous Q24H  . feeding supplement (ENSURE ENLIVE)  237 mL Oral BID BM  . senna  1 tablet Oral BID  . tiotropium  1 capsule Inhalation Daily  . cyanocobalamin  1,000 mcg Oral Daily   Continuous Infusions:     LOS: 4 days    Time spent: 30 minutes.    Hosie Poisson, MD [facility] Pager [phone]  If 7PM-7AM, please contact night-coverage [email or web address] Password [facility] 06/18/2016, 6:47 PM

## 2016-06-18 NOTE — Progress Notes (Signed)
Occupational Therapy Treatment Patient Details Name: Karen Coffey MRN: [id number] DOB: Nov 30, 1945 Today's Date: 06/18/2016    History of present illness Pt admitted with L hip fx after falling, s/p ORIF. PMH including but not limited to COPD, lung ca.   OT comments  Pt continues to require +2 assistance for all mobility. Used Stedy for transfer to chair as pt responds favorably to pulling up. Pt participating in UB bathing, dressing and pericare in supine and grooming in sitting. Pt with confusion and anxiety. SNF continues to be the optimal d/c plan.  Follow Up Recommendations  SNF;Supervision/Assistance - 24 hour    Equipment Recommendations       Recommendations for Other Services      Precautions / Restrictions Precautions Precautions: Fall Restrictions Weight Bearing Restrictions: Yes LLE Weight Bearing: Weight bearing as tolerated       Mobility Bed Mobility Overal bed mobility: Needs Assistance;+2 for physical assistance Bed Mobility: Supine to Sit     Supine to sit: +2 for physical assistance;Total assist     General bed mobility comments: assist for LEs and to raise trunk  Transfers Overall transfer level: Needs assistance Equipment used:  (stedy) Transfers: Sit to/from Stand Sit to Stand: Max assist;+2 physical assistance;+2 safety/equipment         General transfer comment: assisted at hips with bed pad or towel from stedy, assist to weight shift to move flaps     Balance Overall balance assessment: Needs assistance Sitting-balance support: Feet supported Sitting balance-Leahy Scale: Poor Sitting balance - Comments: posterior lean improves with use of stedy frame in front to correct weight shifting, kyphotic posturing noted in seated position.       Standing balance-Leahy Scale: Zero                     ADL Overall ADL's : Needs assistance/impaired Eating/Feeding: Set up;Sitting Eating/Feeding Details (indicate cue type and reason): pt  noted to hold fluids in her mouth prior to swallowing, notified nurse to monitor Grooming: Wash/dry hands;Wash/dry face;Brushing hair;Bed level;Sitting;Set up   Upper Body Bathing: Sitting;Moderate assistance;Bed level Upper Body Bathing Details (indicate cue type and reason): assist for back, pt washed arms and chest supine in bed     Upper Body Dressing : Moderate assistance;Bed level Upper Body Dressing Details (indicate cue type and reason): changed soiled gown     Toilet Transfer: +2 for physical assistance;Total assistance (with stedy)   Toileting- Clothing Manipulation and Hygiene: Total assistance;Sit to/from stand (from stedy)                Vision                     Perception     Praxis      Cognition   Behavior During Therapy: Anxious Overall Cognitive Status: Impaired/Different from baseline Area of Impairment: Memory;Following commands     Memory: Decreased short-term memory  Following Commands: Follows one step commands with increased time (and tactile cues)     Problem Solving: Slow processing;Decreased initiation;Difficulty sequencing;Requires verbal cues;Requires tactile cues General Comments: pt with increased guarding when assisted to move L LE or perform bed mobility    Extremity/Trunk Assessment               Exercises     Shoulder Instructions       General Comments      Pertinent Vitals/ Pain       Pain Assessment: Faces Faces Pain Scale:  Hurts even more Pain Location: L hip Pain Descriptors / Indicators: Guarding;Grimacing;Moaning Pain Intervention(s): Monitored during session;Repositioned  Home Living                                          Prior Functioning/Environment              Frequency Min 2X/week     Progress Toward Goals  OT Goals(current goals can now be found in the care plan section)  Progress towards OT goals: Progressing toward goals  Acute Rehab OT Goals Patient  Stated Goal: to get back to walking Time For Goal Achievement: 06/30/16 Potential to Achieve Goals: [place] Discharge plan remains appropriate    Co-evaluation    PT/OT/SLP Co-Evaluation/Treatment: Yes Reason for Co-Treatment: For patient/therapist safety;Necessary to address cognition/behavior during functional activity          End of Session Equipment Utilized During Treatment: Oxygen (stedy)   Activity Tolerance Patient limited by pain (and confusion)   Patient Left in chair;with call bell/phone within reach;with chair alarm set;with family/visitor present   Nurse Communication Mobility status (use stedy)        Time: 3149-7026 OT Time Calculation (min): 36 min  Charges: OT General Charges $OT Visit: 1 Procedure OT Treatments $Self Care/Home Management : 8-22 mins  Malka So 06/18/2016, 9:41 AM  [phone]

## 2016-06-18 NOTE — Progress Notes (Signed)
Daughter and patient have decided upon discharge they will like to go to [facility].   Suad Autrey

## 2016-06-18 NOTE — Progress Notes (Signed)
   06/18/16 1000  OT Visit Information  Last OT Received On 06/18/16  Assistance Needed +2  PT/OT/SLP Co-Evaluation/Treatment Yes  Reason for Co-Treatment For patient/therapist safety;Necessary to address cognition/behavior during functional activity  OT goals addressed during session ADL's and self-care  History of Present Illness Pt admitted with L hip fx after falling, s/p ORIF. PMH including but not limited to COPD, lung ca.  Precautions  Precautions Fall  Pain Assessment  Pain Assessment Faces  Faces Pain Scale 6  Pain Location L hip  Pain Descriptors / Indicators Grimacing;Guarding;Moaning  Pain Intervention(s) Repositioned;Monitored during session  Cognition  Arousal/Alertness Awake/alert  Behavior During Therapy Anxious  Overall Cognitive Status Impaired/Different from baseline  Area of Impairment Memory;Following commands  Memory Decreased short-term memory  Following Commands Follows one step commands with increased time (and tactile cues)  Problem Solving Slow processing;Decreased initiation;Difficulty sequencing;Requires verbal cues;Requires tactile cues  General Comments pt guarding with sit to stand and assist to weight shift in stedy  ADL  Overall ADL's  Needs assistance/impaired  Toilet Transfer +2 for physical assistance;Total assistance;Cueing for sequencing;+2 for safety/equipment (with stedy)  Toilet Transfer Details (indicate cue type and reason) transferred from bed to 3 in 1 and back  Toileting- Clothing Manipulation and Hygiene Total assistance;Sit to/from stand;+2 for physical assistance;+2 for safety/equipment  Restrictions  Weight Bearing Restrictions Yes  LLE Weight Bearing WBAT  OT - End of Session  Equipment Utilized During Treatment Oxygen (stedy)  Activity Tolerance Patient tolerated treatment well  Patient left in chair;with call bell/phone within reach;with chair alarm set;with family/visitor present  OT Assessment/Plan  OT Plan Discharge  plan remains appropriate  OT Frequency (ACUTE ONLY) Min 2X/week  Follow Up Recommendations SNF;Supervision/Assistance - 24 hour  OT Goal Progression  Progress towards OT goals Progressing toward goals  Acute Rehab OT Goals  Patient Stated Goal to get back to walking  Time For Goal Achievement 06/30/16  Potential to Achieve Goals Fair  OT Time Calculation  OT Start Time (ACUTE ONLY) 0929  OT Stop Time (ACUTE ONLY) 1004  OT Time Calculation (min) 35 min  OT General Charges  $OT Visit 1 Procedure  OT Treatments  $Self Care/Home Management  8-22 mins  06/18/2016 Nestor Lewandowsky, OTR/L Pager: [phone]

## 2016-06-19 LAB — TYPE AND SCREEN
ABO/RH(D): O POS
ANTIBODY SCREEN: NEGATIVE
Unit division: 0

## 2016-06-19 LAB — BASIC METABOLIC PANEL
ANION GAP: 7 (ref 5–15)
BUN: 5 mg/dL — AB (ref 6–20)
CO2: 27 mmol/L (ref 22–32)
Calcium: 8 mg/dL — ABNORMAL LOW (ref 8.9–10.3)
Chloride: 101 mmol/L (ref 101–111)
Creatinine, Ser: 0.77 mg/dL (ref 0.44–1.00)
GFR calc Af Amer: >60 mL/min (ref >60)
Glucose, Bld: 109 mg/dL — ABNORMAL HIGH (ref 65–99)
POTASSIUM: 3.1 mmol/L — AB (ref 3.5–5.1)
SODIUM: 135 mmol/L (ref 135–145)

## 2016-06-19 LAB — HEMOGLOBIN AND HEMATOCRIT, BLOOD
HEMATOCRIT: 28.7 % — AB (ref 36.0–46.0)
Hemoglobin: 9.2 g/dL — ABNORMAL LOW (ref 12.0–15.0)

## 2016-06-19 MED ORDER — POLYETHYLENE GLYCOL 3350 17 G PO PACK: 17.0000 g | PACK | Freq: Every day | ORAL | 0 refills | Status: DC | PRN

## 2016-06-19 MED ORDER — ACETAMINOPHEN 325 MG PO TABS: 650.0000 mg | ORAL_TABLET | Freq: Four times a day (QID) | ORAL | 0 refills | Status: DC | PRN

## 2016-06-19 MED ORDER — IPRATROPIUM BROMIDE 0.02 % IN SOLN: 0.5000 mg | RESPIRATORY_TRACT | 12 refills | Status: DC | PRN

## 2016-06-19 MED ORDER — FERROUS SULFATE 325 (65 FE) MG PO TABS: 325.0000 mg | ORAL_TABLET | Freq: Two times a day (BID) | ORAL | 3 refills | Status: DC

## 2016-06-19 MED ORDER — POTASSIUM CHLORIDE CRYS ER 20 MEQ PO TBCR
40.0000 meq | EXTENDED_RELEASE_TABLET | Freq: Two times a day (BID) | ORAL | Status: DC
  Administered 2016-06-19: 40 meq via ORAL
  Filled 2016-06-19: qty 2

## 2016-06-19 MED ORDER — POTASSIUM CHLORIDE CRYS ER 20 MEQ PO TBCR: 40.0000 meq | EXTENDED_RELEASE_TABLET | Freq: Two times a day (BID) | ORAL | 0 refills | Status: DC

## 2016-06-19 MED ORDER — SENNA 8.6 MG PO TABS: 1.0000 | ORAL_TABLET | Freq: Every day | ORAL | 0 refills | Status: DC | PRN

## 2016-06-19 MED ORDER — FERROUS SULFATE 325 (65 FE) MG PO TABS
325.0000 mg | ORAL_TABLET | Freq: Two times a day (BID) | ORAL | Status: DC
  Administered 2016-06-19: 325 mg via ORAL

## 2016-06-19 MED ORDER — TRAMADOL HCL 50 MG PO TABS: 50.0000 mg | ORAL_TABLET | Freq: Four times a day (QID) | ORAL | Status: DC | PRN

## 2016-06-19 MED ORDER — ENSURE ENLIVE PO LIQD: 237.0000 mL | Freq: Two times a day (BID) | ORAL | 12 refills | Status: DC

## 2016-06-19 MED ORDER — TRAMADOL HCL 50 MG PO TABS: 50.0000 mg | ORAL_TABLET | Freq: Four times a day (QID) | ORAL | 0 refills | Status: DC | PRN

## 2016-06-19 NOTE — Progress Notes (Signed)
Report gave to [place] at [facility]. Pt dischagred without discomfort.

## 2016-06-19 NOTE — Progress Notes (Addendum)
Subjective: 4 Days Post-Op Procedure(s) (LRB): INTRAMEDULLARY (IM) NAIL INTERTROCHANTRIC FRACTURE LEFT (Left) Patient reports pain as moderate.  Pain with motion.  Objective: Vital signs in last 24 hours: Temp:  [97.4 F (36.3 C)-98.6 F (37 C)] 97.4 F (36.3 C) (08/05 0440) Pulse Rate:  [74-150] 74 (08/05 0440) Resp:  [15-20] 15 (08/05 0440) BP: (122-139)/(50-78) 127/52 (08/05 0440) SpO2:  [90 %-100 %] 97 % (08/05 0440)  Intake/Output from previous day: 08/04 0701 - 08/05 0700 In: 690 [P.O.:230; I.V.:50; Blood:410] Out: -  Intake/Output this shift: No intake/output data recorded.   Recent Labs  06/17/16 0428 06/18/16 0316  HGB 8.6* 7.3*    Recent Labs  06/17/16 0428 06/18/16 0316  WBC 12.5* 8.2  RBC 2.94* 2.46*  HCT 26.4* 22.1*  PLT 200 190    Recent Labs  06/17/16 0428 06/18/16 1832  NA 132* 137  K 3.5 3.1*  CL 100* 103  CO2 21* 28  BUN 8 6  CREATININE 0.91 0.73  GLUCOSE 108* 110*  CALCIUM 7.8* 7.8*   No results for input(s): LABPT, INR in the last 72 hours.  Neurologically intact  Assessment/Plan: 4 Days Post-Op Procedure(s) (LRB): INTRAMEDULLARY (IM) NAIL INTERTROCHANTRIC FRACTURE LEFT (Left) Up with therapy   SNF  [facility].  Malee Grays C 06/19/2016, 9:53 AM

## 2016-06-19 NOTE — Progress Notes (Signed)
Attempted to call pt daughter to update discharge time and nobody pick up phone. Will attempt later.

## 2016-06-19 NOTE — Progress Notes (Signed)
Physical Therapy Treatment Patient Details Name: Karen Coffey MRN: [id number] DOB: March 01, 1946 Today's Date: 06/19/2016    History of Present Illness Pt admitted with L hip fx after falling, s/p ORIF. PMH including but not limited to COPD, lung ca.    PT Comments    Pt progressing slowly, did better sitting up EOB today and transferred to chair with +2 mod A, continues to have string posterior lean and decreased wt shift to left side. PT will continue to follow.   Follow Up Recommendations  SNF     Equipment Recommendations  None recommended by PT    Recommendations for Other Services       Precautions / Restrictions Precautions Precautions: Fall Restrictions Weight Bearing Restrictions: Yes LLE Weight Bearing: Weight bearing as tolerated    Mobility  Bed Mobility Overal bed mobility: Needs Assistance;+2 for physical assistance Bed Mobility: Rolling;Supine to Sit Rolling: +2 for physical assistance;Max assist   Supine to sit: +2 for physical assistance;Mod assist     General bed mobility comments: +2 max A for rolling on and off bed pan before getting OOB. With vc's, pt able to participate with supine to sit, mod A for elevation of trunk  as well as LE's off bed  Transfers Overall transfer level: Needs assistance   Transfers: Stand Pivot Transfers;Sit to/from Stand Sit to Stand: +2 physical assistance;Mod assist Stand pivot transfers: +2 physical assistance;Mod assist       General transfer comment: right knee blocked, mod A +2 for fwd wt shift and power up, small pivot steps to chair with mod A +2 for support, pt anxious throughout and fearful of falling  Ambulation/Gait                 Stairs            Wheelchair Mobility    Modified Rankin (Stroke Patients Only)       Balance Overall balance assessment: Needs assistance;History of Falls Sitting-balance support: Feet supported;Bilateral upper extremity supported Sitting balance-Leahy  Scale: Poor Sitting balance - Comments: inial mod A needed to maintain sitting due to posterior lean, progressed to supervision level Postural control: Posterior lean Standing balance support: Bilateral upper extremity supported Standing balance-Leahy Scale: Poor                      Cognition Arousal/Alertness: Awake/alert Behavior During Therapy: Anxious Overall Cognitive Status: Impaired/Different from baseline Area of Impairment: Memory Orientation Level: Disoriented to;Time   Memory: Decreased short-term memory Following Commands: Follows one step commands consistently;Follows multi-step commands with increased time       General Comments: pt with less confusion today    Exercises General Exercises - Lower Extremity Ankle Circles/Pumps: AROM;Both;15 reps;Seated    General Comments        Pertinent Vitals/Pain Pain Assessment: Faces Faces Pain Scale: Hurts even more Pain Location: left hip Pain Descriptors / Indicators: Grimacing;Moaning Pain Intervention(s): Limited activity within patient's tolerance;Monitored during session    Home Living                      Prior Function            PT Goals (current goals can now be found in the care plan section) Acute Rehab PT Goals Patient Stated Goal: to get back to walking PT Goal Formulation: With patient Time For Goal Achievement: 06/23/16 Potential to Achieve Goals: Fair Progress towards PT goals: Progressing toward goals    Frequency  Min 3X/week    PT Plan Current plan remains appropriate    Co-evaluation             End of Session Equipment Utilized During Treatment: Gait belt Activity Tolerance: Patient limited by pain Patient left: in chair;with call bell/phone within reach;with chair alarm set     Time: 1042-1106 PT Time Calculation (min) (ACUTE ONLY): 24 min  Charges:  $Therapeutic Activity: 23-37 mins                    G Codes:     Leighton Roach, PT  Acute Rehab  Services  [phone]  Leighton Roach 06/19/2016, 11:44 AM

## 2016-06-19 NOTE — Clinical Social Work Note (Signed)
CSW notified by MD that pt medically stable for discharge to SNF. CSW contacted [facility] and notified social worker pt discharging from hospital today. Social worker at [facility] requested pt arrive around 2/3pm today. CSW spoke with pt and pt family. Discharge summary and orders faxed to SNF and CSW requested nurse call SNF and give report.

## 2016-06-19 NOTE — Discharge Summary (Addendum)
Physician Discharge Summary  Karen Coffey GEX:[id number] DOB: 1946/11/15 DOA: 06/14/2016  PCP: Maryruth Eve, MD  Admit date: 06/14/2016 Discharge date: 06/19/2016  Admitted From: Home.  Disposition: [facility]  Recommendations for Outpatient Follow-up:  Please follow up with PCP in one week,  Please obtain CBC to check hemoglobin and BMP to check potassium level tomorrow at the facility.  please follow up with orthopedics as recommended.  Because of your severe copd changes in the lungs, you will need referral for pulmonologist for management of COPD.  You will also need a referral and follow up with a neurologist for transient episodes of confusion during the hospitalization. You were found to have iron deficiency anemia in addition to anemia of blood loss from the surgery, you will need further work up with a colonoscopy and an upper endoscopy as outpatient  And a referral to gastroenterology.    Equipment/Devices:3 lit of [place] oxygen.   Discharge Condition:stable.  CODE STATUS:FULL  Diet recommendation: Heart Healthy /   Brief/Interim Summary: 70 year old female with PMH of COPD, non-small cell lung cancer and clinical remission since October 2015, presented to ED with severe left hip pain and deformity after a ground-level mechanical fall. Recently contacted her pulmonologist for COPD exacerbation and was prescribed a course of prednisone and azithromycin that she has not but respiratory status improved. Admitted for intertrochanteric fracture of left hip. Underwent surgical fixation by orthopedics on 06/15/16 at [facility]. DC to SNF when clinically improved.  Discharge Diagnoses:  Principal Problem:   Closed left hip fracture (HCC) Active Problems:   COPD exacerbation (HCC)   Normocytic anemia   Leukocytosis   Hip fracture (HCC)    Closed left intertrochanteric hip fracture - Sustained status post mechanical ground-level fall. - Orthopedics was consulted and patient underwent  ORIF/intramedullary implant on 06/15/16 - As recommended by orthopedics, consider outpatient evaluation for osteoporosis including bone density, vitamin D deficiency etc. - As per orthopedics, weightbearing as tolerated, outpatient follow-up with Dr. Eduard Roux in 2 weeks for staple removal and currently on Lovenox for DVT prophylaxis (outpatient DVT prophylaxis to be clarified with orthopedics prior to discharge) - pain well controlled.  PT eval recommending SNF placement.  - no new recommendations.   COPD - Reported recent flare but seemed to have resolved without initiating any treatment. - Currently stable. - Incentive spirometry. - she reports she is on 3 lit of [place] oxygen at home, but doesn't use it regularly, but apparently the daughter reported that patient doesn't use oxygen. - here we were not able to wean he roff the oxygen.  Continue with 3 lit , she denies any cough and there is no wheezing heard.  - repeat CXR ordered and it was abnormal, ordered CT chest with contrast  Which showed atelectasis. Discussed the results with the patient.   Acute blood loss anemia complicating chronic/ iron deficiency anemia - baseline hemoglobin  Is around 12 , dropped to 7.3 - 1 unit of prbc ordered and repeat hemoglobin for tomorrow.  - iron supplementation added.  - recommend outpatient work up for iron deficiency anemia Repeat hemoglobin pending.   UTI Urine cultures were not done on admission and she Completed 5 days of IV rocephin. No antibiotics on discharge. .   Tachycardia on ambulation:  Persistent, .  EKG shows tachycardia, which resolved spontaneously.  If tachycardia persists she will need further work up with an echocardiogram.  Would not use bb because of her history of severe copd.    Mild confusion  from pain medications: she had MRI of the brain less than 6 months ago , showed white matter changes from exposure to whole brain radiation.     Medication List    STOP  taking these medications   azithromycin 250 MG tablet Commonly known as:  ZITHROMAX   doxycycline 100 MG tablet Commonly known as:  VIBRA-TABS   HYDROcodone-acetaminophen 5-325 MG tablet Commonly known as:  NORCO/VICODIN   predniSONE 10 MG tablet Commonly known as:  DELTASONE     TAKE these medications   acetaminophen 325 MG tablet Commonly known as:  TYLENOL Take 2 tablets (650 mg total) by mouth every 6 (six) hours as needed for mild pain (or Fever >/= 101).   aspirin 81 MG chewable tablet Chew 81 mg by mouth daily.   atorvastatin 10 MG tablet Commonly known as:  LIPITOR Take 10 mg by mouth daily.   BREO ELLIPTA 100-25 MCG/INH Aepb Generic drug:  fluticasone furoate-vilanterol INHALE 1 PUFF INTO THE LUNGS DAILY. INS WILL PAY ON 03-24-15 What changed:  Another medication with the same name was removed. Continue taking this medication, and follow the directions you see here.   enoxaparin 40 MG/0.4ML injection Commonly known as:  LOVENOX Inject 0.4 mLs (40 mg total) into the skin daily.   feeding supplement (ENSURE ENLIVE) Liqd Take 237 mLs by mouth 2 (two) times daily between meals.   ferrous sulfate 325 (65 FE) MG tablet Take 1 tablet (325 mg total) by mouth 2 (two) times daily with a meal.   ibuprofen 200 MG tablet Commonly known as:  ADVIL,MOTRIN Take 200 mg by mouth every 6 (six) hours as needed for headache, mild pain or moderate pain.   ipratropium 0.02 % nebulizer solution Commonly known as:  ATROVENT Take 2.5 mLs (0.5 mg total) by nebulization every 4 (four) hours as needed for wheezing or shortness of breath.   levalbuterol 45 MCG/ACT inhaler Commonly known as:  XOPENEX HFA INHALE 1-2 PUFFS INTO THE LUNGS EVERY 4 HRS AS NEEDED FOR WHEEZING   oxyCODONE 5 MG immediate release tablet Commonly known as:  Oxy IR/ROXICODONE Take 1-3 tablets (5-15 mg total) by mouth every 4 (four) hours as needed.   polyethylene glycol packet Commonly known as:  MIRALAX /  GLYCOLAX Take 17 g by mouth daily as needed for mild constipation.   potassium chloride SA 20 MEQ tablet Commonly known as:  K-DUR,KLOR-CON Take 2 tablets (40 mEq total) by mouth 2 (two) times daily.   RA VITAMIN B-12 TR 1000 MCG Tbcr Generic drug:  Cyanocobalamin Take by mouth. What changed:  Another medication with the same name was removed. Continue taking this medication, and follow the directions you see here.   senna 8.6 MG Tabs tablet Commonly known as:  SENOKOT Take 1 tablet (8.6 mg total) by mouth daily as needed for mild constipation.   Tiotropium Bromide Monohydrate 2.5 MCG/ACT Aers Commonly known as:  SPIRIVA RESPIMAT Inhale 2 puffs into the lungs daily.   SPIRIVA RESPIMAT 2.5 MCG/ACT Aers Generic drug:  Tiotropium Bromide Monohydrate INHALE 2 PUFFS INTO THE LUNGS DAILY.   traMADol 50 MG tablet Commonly known as:  ULTRAM Take 1 tablet (50 mg total) by mouth every 6 (six) hours as needed for moderate pain.   Vitamin D (Ergocalciferol) 50000 units Caps capsule Commonly known as:  DRISDOL TAKE 50,000 UNITS BY MOUTH EVERY 7 DAYS.     Discharge Instructions  Discharge Instructions    Diet - low sodium heart healthy  Complete by:  As directed   Discharge instructions    Complete by:  As directed   Please follow up with PCP in one week,  Please obtain CBC to check hemoglobin and BMP to check potassium level tomorrow at the facility.  please follow up with orthopedics as recommended.  Because of your severe copd changes in the lungs, you will need referral for pulmonologist for management of COPD.  You will also need a referral and follow up with a neurologist for transient episodes of confusion during the hospitalization.   Weight bearing as tolerated    Complete by:  As directed      Follow-up Information    Marianna Payment, MD Follow up in 2 week(s).   Specialty:  Orthopedic Surgery Why:  For suture removal, For wound re-check Contact information: [place] [phone] [place], [place]. Schedule an appointment as soon as possible for a visit in 1 week(s).   Specialty:  Family Medicine Contact information: [place] [address] [phone]          Allergies  Allergen Reactions  . Albuterol Sulfate     tachycardia    Consultations:  Orthopedics.    Procedures/Studies: Dg Chest 1 View  Result Date: 06/14/2016 CLINICAL DATA:  Peri-op, hip fracture. Hx of COPD, lung mass. Current every day smoker. EXAM: CHEST 1 VIEW COMPARISON:  None. FINDINGS: Lungs are hyperinflated. There is perihilar peribronchial thickening. Biapical pleural parenchymal thickening is identified, right greater than left and appears chronic. No focal consolidations or pleural effusions. No pulmonary edema. IMPRESSION: Hyperinflation and bronchitic change. No evidence for acute pulmonary abnormality. Electronically Signed   By: Nolon Nations M.D.   On: 06/14/2016 19:12   Ct Chest W Contrast  Result Date: 06/18/2016 CLINICAL DATA:  Hypoxia.  History of pleurisy, lung mass and COPD. EXAM: CT CHEST WITH CONTRAST TECHNIQUE: Multidetector CT imaging of the chest was performed during intravenous contrast administration. CONTRAST:  67m ISOVUE-300 IOPAMIDOL (ISOVUE-300) INJECTION 61% COMPARISON:  Chest CT dated 10/19/2014. FINDINGS: Cardiovascular: Thoracic aorta is normal in caliber and configuration. No aortic aneurysm or dissection. Heart size is normal. No pericardial effusion. Mediastinum/Nodes: Subtle soft tissue within the aortopulmonary window region, markedly decreased compared to the previous exam. Scattered small lymph nodes in the mediastinum and right hilum. Secretions within the lower portion of the trachea. Lungs/Pleura: Bilateral emphysematous change, moderate to severe in degree, progressed since the previous study. Associated interstitial fibrosis within the  lower lobes bilaterally. Scarring in the left upper lobe. Confluent scarring/fibrosis within the lung apices. The large left upper lobe mass seen on previous CT is resolved. Patchy small consolidations at the left lung base posteriorly, most likely atelectasis. Small left pleural effusion. Trace right pleural effusion. Upper Abdomen: Limited images of the upper abdomen are unremarkable. Musculoskeletal: Mild degenerative change within the kyphotic thoracic spine. No acute or suspicious osseous finding. Superficial soft tissues are unremarkable. IMPRESSION: 1. Diffuse bilateral emphysematous change, moderate to severe in degree, progressed since the previous CT of 10/20/2011. 2. Mild scarring in the left upper lobe. Additional confluent scarring/fibrosis at each lung apex. The large left upper lobe mass demonstrated on the earlier chest CT has resolved. Subtle soft tissue thickening again noted in the aorta pulmonary window region, also markedly decreased, possibly post treatment change. 3. Small left pleural effusion and trace right pleural effusion. 4. Patchy small consolidations at the left lung  base are most likely atelectasis, pneumonia less likely. 5. Heart size is normal.  No aortic aneurysm or dissection. Electronically Signed   By: Franki Cabot M.D.   On: 06/18/2016 23:39   Dg Chest Port 1 View  Result Date: 06/18/2016 CLINICAL DATA:  Hypoxia EXAM: PORTABLE CHEST 1 VIEW COMPARISON:  06/14/2016 FINDINGS: The cardiac shadow is within normal limits. The lungs are well aerated bilaterally. A nodular density is noted in the right mid lung which was not as well appreciated on the prior exam. This may be related to confluence of parenchymal shadows. No other focal abnormality is noted. IMPRESSION: Nodular appearing density in the right lung. This was not well appreciated on the recent exam and may be artifactual in nature. CT is recommended for are further evaluation. Electronically Signed   By: Inez Catalina  M.D.   On: 06/18/2016 13:44   Dg C-arm 1-60 Min  Result Date: 06/15/2016 CLINICAL DATA:  Lt femur IM nail intertrochanteric Fracture fluoro time 22mn 30 seconds EXAM: LEFT FEMUR PORTABLE 2 VIEWS; DG C-ARM 61-120 MIN COMPARISON:  06/14/2016 FINDINGS: Images demonstrate proximal and distal extent of IM nail fixation of left intertrochanteric fracture. Two lag screws traverse the femoral neck. No interval fractures are identified. No evidence for subluxation or dislocation. IMPRESSION: Status post ORIF of left intertrochanteric fracture Electronically Signed   By: ENolon NationsM.D.   On: 06/15/2016 08:32   Dg Hip Unilat With Pelvis 2-3 Views Left  Result Date: 06/14/2016 CLINICAL DATA:  Pt states she fell at home today onto left hip. Left hip pain since then. EXAM: DG HIP (WITH OR WITHOUT PELVIS) 2-3V LEFT COMPARISON:  None. FINDINGS: There is not intertrochanteric fracture of the left hip with little displacement or angulation. No dislocation or subluxation. IMPRESSION: Intertrochanteric fracture of the left hip. Electronically Signed   By: ENolon NationsM.D.   On: 06/14/2016 18:00   Dg Femur Port Min 2 Views Left  Result Date: 06/15/2016 CLINICAL DATA:  Lt femur IM nail intertrochanteric Fracture fluoro time 173m 30 seconds EXAM: LEFT FEMUR PORTABLE 2 VIEWS; DG C-ARM 61-120 MIN COMPARISON:  06/14/2016 FINDINGS: Images demonstrate proximal and distal extent of IM nail fixation of left intertrochanteric fracture. Two lag screws traverse the femoral neck. No interval fractures are identified. No evidence for subluxation or dislocation. IMPRESSION: Status post ORIF of left intertrochanteric fracture Electronically Signed   By: ElNolon Nations.D.   On: 06/15/2016 08:32      Subjective:  Pain well controlled.  Discharge Exam: Vitals:   06/19/16 0440 06/19/16 1350  BP: (!) 127/52 110/68  Pulse: 74 87  Resp: 15 16  Temp: 97.4 F (36.3 C) 98.1 F (36.7 C)   Vitals:   06/18/16 1730  06/18/16 2300 06/19/16 0440 06/19/16 1350  BP: (!) 122/55 (!) 134/50 (!) 127/52 110/68  Pulse: 91 88 74 87  Resp: '18 15 15 16  '$ Temp: 97.4 F (36.3 C) 97.5 F (36.4 C) 97.4 F (36.3 C) 98.1 F (36.7 C)  TempSrc: Axillary Axillary Axillary Oral  SpO2: 100% 90% 97% 95%  Weight:      Height:        General: Pt is alert, awake, not in acute distress Cardiovascular: RRR, S1/S2 +, no rubs, no gallops Respiratory: CTA bilaterally, no wheezing, no rhonchi Abdominal: Soft, NT, ND, bowel sounds + Extremities: no edema, no cyanosis    The results of significant diagnostics from this hospitalization (including imaging, microbiology, ancillary and laboratory) are listed below  for reference.     Microbiology: No results found for this or any previous visit (from the past 240 hour(s)).   Labs: BNP (last 3 results) No results for input(s): BNP in the last 8760 hours. Basic Metabolic Panel:  Recent Labs Lab 06/14/16 1830 06/15/16 1135 06/16/16 0611 06/17/16 0428 06/18/16 1832 06/19/16 0953  NA 135  --  135 132* 137 135  K 4.1  --  3.7 3.5 3.1* 3.1*  CL 104  --  103 100* 103 101  CO2 25  --  27 21* 28 27  GLUCOSE 106*  --  99 108* 110* 109*  BUN 9  --  '9 8 6 '$ 5*  CREATININE 0.86 0.91 0.89 0.91 0.73 0.77  CALCIUM 8.9  --  8.1* 7.8* 7.8* 8.0*   Liver Function Tests: No results for input(s): AST, ALT, ALKPHOS, BILITOT, PROT, ALBUMIN in the last 168 hours. No results for input(s): LIPASE, AMYLASE in the last 168 hours. No results for input(s): AMMONIA in the last 168 hours. CBC:  Recent Labs Lab 06/14/16 1830  06/15/16 1135 06/16/16 0611 06/17/16 0428 06/18/16 0316 06/19/16 0953  WBC 11.5*  --  10.0 9.9 12.5* 8.2  --   NEUTROABS 9.7*  --   --   --   --   --   --   HGB 11.6*  --  10.1* 8.9* 8.6* 7.3* 9.2*  HCT 34.6*  < > 31.5* 27.6* 26.4* 22.1* 28.7*  MCV 89.6  --  91.8 90.8 89.8 89.8  --   PLT 221  --  185 180 200 190  --   < > = values in this interval not  displayed. Cardiac Enzymes: No results for input(s): CKTOTAL, CKMB, CKMBINDEX, TROPONINI in the last 168 hours. BNP: Invalid input(s): POCBNP CBG: No results for input(s): GLUCAP in the last 168 hours. D-Dimer No results for input(s): DDIMER in the last 72 hours. Hgb A1c No results for input(s): HGBA1C in the last 72 hours. Lipid Profile No results for input(s): CHOL, HDL, LDLCALC, TRIG, CHOLHDL, LDLDIRECT in the last 72 hours. Thyroid function studies  Recent Labs  06/18/16 1207  TSH 1.386   Anemia work up No results for input(s): VITAMINB12, FOLATE, FERRITIN, TIBC, IRON, RETICCTPCT in the last 72 hours. Urinalysis    Component Value Date/Time   COLORURINE YELLOW 06/14/2016 1814   APPEARANCEUR CLOUDY (A) 06/14/2016 1814   LABSPEC 1.013 06/14/2016 1814   PHURINE 5.5 06/14/2016 1814   GLUCOSEU NEGATIVE 06/14/2016 1814   HGBUR SMALL (A) 06/14/2016 1814   BILIRUBINUR NEGATIVE 06/14/2016 1814   KETONESUR NEGATIVE 06/14/2016 1814   PROTEINUR NEGATIVE 06/14/2016 1814   NITRITE POSITIVE (A) 06/14/2016 1814   LEUKOCYTESUR MODERATE (A) 06/14/2016 1814   Sepsis Labs Invalid input(s): PROCALCITONIN,  WBC,  LACTICIDVEN Microbiology No results found for this or any previous visit (from the past 240 hour(s)).   Time coordinating discharge: Over 30 minutes  SIGNED:   Hosie Poisson, MD  [facility] 06/19/2016, 1:56 PM Pager   If 7PM-7AM, please contact night-coverage [email or web address] Password [email or web address]

## 2016-06-21 ENCOUNTER — Other Ambulatory Visit: Payer: Self-pay

## 2016-06-21 ENCOUNTER — Non-Acute Institutional Stay (SKILLED_NURSING_FACILITY): Payer: Medicare Other | Admitting: Adult Health

## 2016-06-21 ENCOUNTER — Encounter: Payer: Self-pay | Admitting: Adult Health

## 2016-06-21 DIAGNOSIS — J449 Chronic obstructive pulmonary disease, unspecified: Secondary | ICD-10-CM

## 2016-06-21 DIAGNOSIS — R2681 Unsteadiness on feet: Secondary | ICD-10-CM | POA: Diagnosis not present

## 2016-06-21 DIAGNOSIS — B370 Candidal stomatitis: Secondary | ICD-10-CM

## 2016-06-21 DIAGNOSIS — K5901 Slow transit constipation: Secondary | ICD-10-CM

## 2016-06-21 DIAGNOSIS — E559 Vitamin D deficiency, unspecified: Secondary | ICD-10-CM

## 2016-06-21 DIAGNOSIS — S72002D Fracture of unspecified part of neck of left femur, subsequent encounter for closed fracture with routine healing: Secondary | ICD-10-CM

## 2016-06-21 DIAGNOSIS — D62 Acute posthemorrhagic anemia: Secondary | ICD-10-CM

## 2016-06-21 DIAGNOSIS — E876 Hypokalemia: Secondary | ICD-10-CM | POA: Diagnosis not present

## 2016-06-21 MED ORDER — TRAMADOL HCL 50 MG PO TABS: 50.0000 mg | ORAL_TABLET | Freq: Four times a day (QID) | ORAL | 0 refills | Status: DC | PRN

## 2016-06-21 MED ORDER — OXYCODONE HCL 5 MG PO TABS: ORAL_TABLET | ORAL | 0 refills | Status: DC

## 2016-06-21 NOTE — Progress Notes (Signed)
Patient ID: Karen Coffey, female   DOB: June 28, 1946, 70 y.o.   MRN: [id number]    DATE:  06/21/2016   MRN:  [id number]  BIRTHDAY: [birth year]  Facility:  [facility]ursing Home Location:  [place] Room Number: 119-E  LEVEL OF CARE:  SNF (31)  Contact Information    Name Relation Home Work [place] Daughter [phone]  [phone]       Code Status History    Date Active Date Inactive Code Status Order ID Comments User Context   06/14/2016  7:54 PM 06/19/2016  5:00 PM Full Code [id number]  Vianne Bulls, MD ED    Advance Directive Documentation   Flowsheet Row Most Recent Value  Type of Advance Directive  Out of facility DNR (pink MOST or yellow form)  Pre-existing out of facility DNR order (yellow form or pink MOST form)  No data  "MOST" Form in Place?  No data       Chief Complaint  Patient presents with  . Hospitalization Follow-up    HISTORY OF PRESENT ILLNESS:  This is a 70 year old female who has been admitted to [facility] on 06/19/16 from [facility]. She has PMH of COPD, non-small cell lung cancer and on clinical remission since October 2015. She had a fall and sustained a closed left hip fracture for which she had ORIF/intramedullary implant on 06/15/16. Her hemoglobin dropped to 7.3 (has baseline hemoglobin around 12) and was transfused with 1 unit packed RBC.   She has been admitted for a short-term rehabilitation.  She is seen in her room today with daughter @ bedside. Patient has complained of oral pain and was noted to have lesion on tongue. Daughter reported that she was on Nystatin while in the hospital.  PAST MEDICAL HISTORY:  Past Medical History:  Diagnosis Date  . COPD (chronic obstructive pulmonary disease) ([place])   . Lung mass   . Pleurisy 2008   left lung     CURRENT MEDICATIONS: Reviewed  Patient's Medications  New Prescriptions   No medications on file  Previous Medications   ACETAMINOPHEN  (TYLENOL) 325 MG TABLET    Take 2 tablets (650 mg total) by mouth every 6 (six) hours as needed for mild pain (or Fever >/= 101).   ASPIRIN 81 MG CHEWABLE TABLET    Chew 81 mg by mouth daily.    ATORVASTATIN (LIPITOR) 10 MG TABLET    Take 10 mg by mouth daily.    BREO ELLIPTA 100-25 MCG/INH AEPB    INHALE 1 PUFF INTO THE LUNGS DAILY. INS WILL PAY ON 03-24-15   CYANOCOBALAMIN (RA VITAMIN B-12 TR) 1000 MCG TBCR    Take by mouth.   ENOXAPARIN (LOVENOX) 40 MG/0.4ML INJECTION    Inject 0.4 mLs (40 mg total) into the skin daily.   FERROUS SULFATE 325 (65 FE) MG TABLET    Take 1 tablet (325 mg total) by mouth 2 (two) times daily with a meal.   IPRATROPIUM (ATROVENT) 0.02 % NEBULIZER SOLUTION    Take 2.5 mLs (0.5 mg total) by nebulization every 4 (four) hours as needed for wheezing or shortness of breath.   LEVALBUTEROL (XOPENEX HFA) 45 MCG/ACT INHALER    INHALE 1-2 PUFFS INTO THE LUNGS EVERY 4 HRS AS NEEDED FOR WHEEZING   POLYETHYLENE GLYCOL (MIRALAX / GLYCOLAX) PACKET    Take 17 g by mouth daily as needed for mild constipation.   POTASSIUM CHLORIDE SA (K-DUR,KLOR-CON) 20 MEQ TABLET  Take 40 mEq by mouth 2 (two) times daily.   SENNA (SENOKOT) 8.6 MG TABS TABLET    Take 1 tablet (8.6 mg total) by mouth daily as needed for mild constipation.   TIOTROPIUM BROMIDE MONOHYDRATE (SPIRIVA RESPIMAT) 2.5 MCG/ACT AERS    INHALE 2 PUFFS INTO THE LUNGS DAILY.   UNABLE TO FIND    Med Name: MedPass 120 mL po BID for nutritional supplement   VITAMIN D, ERGOCALCIFEROL, (DRISDOL) 50000 UNITS CAPS CAPSULE    TAKE 50,000 UNITS BY MOUTH EVERY 7 DAYS.  Modified Medications   Modified Medication Previous Medication   OXYCODONE (OXY IR/ROXICODONE) 5 MG IMMEDIATE RELEASE TABLET oxyCODONE (OXY IR/ROXICODONE) 5 MG immediate release tablet      Take 1 to 2 tablets by mouth every 4 hours as needed    Take 1-3 tablets (5-15 mg total) by mouth every 4 (four) hours as needed.   TRAMADOL (ULTRAM) 50 MG TABLET traMADol (ULTRAM) 50 MG  tablet      Take 1 tablet (50 mg total) by mouth every 6 (six) hours as needed for moderate pain.    Take 1 tablet (50 mg total) by mouth every 6 (six) hours as needed for moderate pain.  Discontinued Medications   FEEDING SUPPLEMENT, ENSURE ENLIVE, (ENSURE ENLIVE) LIQD    Take 237 mLs by mouth 2 (two) times daily between meals.   IBUPROFEN (ADVIL,MOTRIN) 200 MG TABLET    Take 200 mg by mouth every 6 (six) hours as needed for headache, mild pain or moderate pain.   POTASSIUM CHLORIDE SA (K-DUR,KLOR-CON) 20 MEQ TABLET    Take 2 tablets (40 mEq total) by mouth 2 (two) times daily.   TIOTROPIUM BROMIDE MONOHYDRATE (SPIRIVA RESPIMAT) 2.5 MCG/ACT AERS    Inhale 2 puffs into the lungs daily.     Allergies  Allergen Reactions  . Albuterol Sulfate     tachycardia     REVIEW OF SYSTEMS:  GENERAL: no change in appetite, no fatigue, no weight changes, no fever, chills or weakness EYES: Denies change in vision, dry eyes, eye pain, itching or discharge EARS: Denies change in hearing, ringing in ears, or earache NOSE: Denies nasal congestion or epistaxis MOUTH and THROAT: complains of oral pain RESPIRATORY: no cough, SOB, DOE, wheezing, hemoptysis CARDIAC: no chest pain, edema or palpitations GI: no abdominal pain, diarrhea, constipation, heart burn, nausea or vomiting GU: Denies dysuria, frequency, hematuria, incontinence, or discharge PSYCHIATRIC: Denies feeling of depression or anxiety. No report of hallucinations, insomnia, paranoia, or agitation    PHYSICAL EXAMINATION  GENERAL APPEARANCE:  In no acute distress. Thin body habitus SKIN:  Left  hip  has 2 surgical incisions with staples and covered with dry dressing, no erythema HEAD: Normal in size and contour. No evidence of trauma EYES: Lids open and close normally. No blepharitis, entropion or ectropion. PERRL. Conjunctivae are clear and sclerae are white. Lenses are without opacity EARS: Pinnae are normal. Patient hears normal voice  tunes of the examiner MOUTH and THROAT: Lips are without lesions. Has lesion on tongue.  NECK: supple, trachea midline, no neck masses, no thyroid tenderness, no thyromegaly LYMPHATICS: no LAN in the neck, no supraclavicular LAN RESPIRATORY: breathing is even & unlabored, BS CTAB; has O2 @ 3L/min continuously CARDIAC: RRR, no murmur,no extra heart sounds, no edema GI: abdomen soft, normal BS, no masses, no tenderness, no hepatomegaly, no splenomegaly EXTREMITIES:  Able to move 4 extremities PSYCHIATRIC: Alert and oriented X 3. Affect and behavior are appropriate  LABS/RADIOLOGY: Labs  reviewed: Basic Metabolic Panel:  Recent Labs  06/17/16 0428 06/18/16 1832 06/19/16 0953  NA 132* 137 135  K 3.5 3.1* 3.1*  CL 100* 103 101  CO2 21* 28 27  GLUCOSE 108* 110* 109*  BUN 8 6 5*  CREATININE 0.91 0.73 0.77  CALCIUM 7.8* 7.8* 8.0*   CBC:  Recent Labs  06/14/16 1830  06/16/16 0611 06/17/16 0428 06/18/16 0316 06/19/16 0953  WBC 11.5*  < > 9.9 12.5* 8.2  --   NEUTROABS 9.7*  --   --   --   --   --   HGB 11.6*  < > 8.9* 8.6* 7.3* 9.2*  HCT 34.6*  < > 27.6* 26.4* 22.1* 28.7*  MCV 89.6  < > 90.8 89.8 89.8  --   PLT 221  < > 180 200 190  --   < > = values in this interval not displayed.   Dg Chest 1 View  Result Date: 06/14/2016 CLINICAL DATA:  Peri-op, hip fracture. Hx of COPD, lung mass. Current every day smoker. EXAM: CHEST 1 VIEW COMPARISON:  None. FINDINGS: Lungs are hyperinflated. There is perihilar peribronchial thickening. Biapical pleural parenchymal thickening is identified, right greater than left and appears chronic. No focal consolidations or pleural effusions. No pulmonary edema. IMPRESSION: Hyperinflation and bronchitic change. No evidence for acute pulmonary abnormality. Electronically Signed   By: Nolon Nations M.D.   On: 06/14/2016 19:12   Ct Chest W Contrast  Result Date: 06/18/2016 CLINICAL DATA:  Hypoxia.  History of pleurisy, lung mass and COPD. EXAM: CT  CHEST WITH CONTRAST TECHNIQUE: Multidetector CT imaging of the chest was performed during intravenous contrast administration. CONTRAST:  28m ISOVUE-300 IOPAMIDOL (ISOVUE-300) INJECTION 61% COMPARISON:  Chest CT dated 10/19/2014. FINDINGS: Cardiovascular: Thoracic aorta is normal in caliber and configuration. No aortic aneurysm or dissection. Heart size is normal. No pericardial effusion. Mediastinum/Nodes: Subtle soft tissue within the aortopulmonary window region, markedly decreased compared to the previous exam. Scattered small lymph nodes in the mediastinum and right hilum. Secretions within the lower portion of the trachea. Lungs/Pleura: Bilateral emphysematous change, moderate to severe in degree, progressed since the previous study. Associated interstitial fibrosis within the lower lobes bilaterally. Scarring in the left upper lobe. Confluent scarring/fibrosis within the lung apices. The large left upper lobe mass seen on previous CT is resolved. Patchy small consolidations at the left lung base posteriorly, most likely atelectasis. Small left pleural effusion. Trace right pleural effusion. Upper Abdomen: Limited images of the upper abdomen are unremarkable. Musculoskeletal: Mild degenerative change within the kyphotic thoracic spine. No acute or suspicious osseous finding. Superficial soft tissues are unremarkable. IMPRESSION: 1. Diffuse bilateral emphysematous change, moderate to severe in degree, progressed since the previous CT of 10/20/2011. 2. Mild scarring in the left upper lobe. Additional confluent scarring/fibrosis at each lung apex. The large left upper lobe mass demonstrated on the earlier chest CT has resolved. Subtle soft tissue thickening again noted in the aorta pulmonary window region, also markedly decreased, possibly post treatment change. 3. Small left pleural effusion and trace right pleural effusion. 4. Patchy small consolidations at the left lung base are most likely atelectasis,  pneumonia less likely. 5. Heart size is normal.  No aortic aneurysm or dissection. Electronically Signed   By: SFranki CabotM.D.   On: 06/18/2016 23:39   Dg Chest Port 1 View  Result Date: 06/18/2016 CLINICAL DATA:  Hypoxia EXAM: PORTABLE CHEST 1 VIEW COMPARISON:  06/14/2016 FINDINGS: The cardiac shadow is within normal limits.  The lungs are well aerated bilaterally. A nodular density is noted in the right mid lung which was not as well appreciated on the prior exam. This may be related to confluence of parenchymal shadows. No other focal abnormality is noted. IMPRESSION: Nodular appearing density in the right lung. This was not well appreciated on the recent exam and may be artifactual in nature. CT is recommended for are further evaluation. Electronically Signed   By: Inez Catalina M.D.   On: 06/18/2016 13:44   Dg C-arm 1-60 Min  Result Date: 06/15/2016 CLINICAL DATA:  Lt femur IM nail intertrochanteric Fracture fluoro time 27mn 30 seconds EXAM: LEFT FEMUR PORTABLE 2 VIEWS; DG C-ARM 61-120 MIN COMPARISON:  06/14/2016 FINDINGS: Images demonstrate proximal and distal extent of IM nail fixation of left intertrochanteric fracture. Two lag screws traverse the femoral neck. No interval fractures are identified. No evidence for subluxation or dislocation. IMPRESSION: Status post ORIF of left intertrochanteric fracture Electronically Signed   By: ENolon NationsM.D.   On: 06/15/2016 08:32   Dg Hip Unilat With Pelvis 2-3 Views Left  Result Date: 06/14/2016 CLINICAL DATA:  Pt states she fell at home today onto left hip. Left hip pain since then. EXAM: DG HIP (WITH OR WITHOUT PELVIS) 2-3V LEFT COMPARISON:  None. FINDINGS: There is not intertrochanteric fracture of the left hip with little displacement or angulation. No dislocation or subluxation. IMPRESSION: Intertrochanteric fracture of the left hip. Electronically Signed   By: ENolon NationsM.D.   On: 06/14/2016 18:00   Dg Femur Port Min 2 Views  Left  Result Date: 06/15/2016 CLINICAL DATA:  Lt femur IM nail intertrochanteric Fracture fluoro time 119m 30 seconds EXAM: LEFT FEMUR PORTABLE 2 VIEWS; DG C-ARM 61-120 MIN COMPARISON:  06/14/2016 FINDINGS: Images demonstrate proximal and distal extent of IM nail fixation of left intertrochanteric fracture. Two lag screws traverse the femoral neck. No interval fractures are identified. No evidence for subluxation or dislocation. IMPRESSION: Status post ORIF of left intertrochanteric fracture Electronically Signed   By: ElNolon Nations.D.   On: 06/15/2016 08:32    ASSESSMENT/PLAN:  Unsteady gait - for rehabilitation, PT and OT  Closed left hip fracture S/P ORIF/intramedullary implant - for rehabilitation, PT and OT; follow-up with orthopedic surgeon, Dr XuErlinda Hongin 2 weeks;  LLE WBAT; continue Lovenox 40 mg subcutaneous daily for DVT prophylaxis; oxycodone 5 mg 1-3 tabs by mouth every 4 hours when necessary, Tylenol 325 mg take 2 tabs = 650 mg by mouth every 6 hours when necessary and tramadol 50 mg 1 tab by mouth every 6 hours when necessary for pain  Oral Candida - start nystatin suspension 100,000 units/gram give 5 ML swish and swallow 4 times a day 2 weeks  Anemia, acute blood loss - S/P transfusion of 1 unit packed RBC; hgb 9.2; continue ferrous sulfate 325 mg 1 tab by mouth twice a day; check CBC  Hypokalemia - K3.1; continue KCL ER 20 meq take 2 tabs = 40 meq by mouth twice a day; check CMP  Constipation - continue MiraLAX 17 g by mouth daily when necessary and senna 8.6 mg 1 tab by mouth daily when necessary  COPD - no SOB; continue O2 at 3 L/minute via [place] continuously, Xopenex HFA 45 g inhaler inhale 1-2 puffs in 2 lungs every 4 hours when necessary, Atrovent  take 2.5 ML/0.5 mg via nebulizer every 4 hours when necessary, Spiriva Respimat 2.5 g INH inhale 2 puffs into the lungs daily and Breo Ellipta  100-25 mcg inhaler take 1 puff into the lungs daily  Vitamin D deficiency - continue  vitamin D2 50,000 units 1 capsule by mouth every week    Goals of care:  Short-term rehabilitation    Durenda Age, NP [facility] [phone]

## 2016-06-21 NOTE — Telephone Encounter (Signed)
Rx faxed to [facility] @ [phone], phone number [phone]  

## 2016-06-22 ENCOUNTER — Non-Acute Institutional Stay (SKILLED_NURSING_FACILITY): Payer: Medicare Other | Admitting: Internal Medicine

## 2016-06-22 ENCOUNTER — Encounter: Payer: Self-pay | Admitting: Internal Medicine

## 2016-06-22 DIAGNOSIS — E559 Vitamin D deficiency, unspecified: Secondary | ICD-10-CM

## 2016-06-22 DIAGNOSIS — J449 Chronic obstructive pulmonary disease, unspecified: Secondary | ICD-10-CM

## 2016-06-22 DIAGNOSIS — S72002D Fracture of unspecified part of neck of left femur, subsequent encounter for closed fracture with routine healing: Secondary | ICD-10-CM | POA: Diagnosis not present

## 2016-06-22 DIAGNOSIS — D62 Acute posthemorrhagic anemia: Secondary | ICD-10-CM | POA: Diagnosis not present

## 2016-06-22 DIAGNOSIS — R5381 Other malaise: Secondary | ICD-10-CM

## 2016-06-22 DIAGNOSIS — R2681 Unsteadiness on feet: Secondary | ICD-10-CM

## 2016-06-22 DIAGNOSIS — K5909 Other constipation: Secondary | ICD-10-CM

## 2016-06-22 DIAGNOSIS — E876 Hypokalemia: Secondary | ICD-10-CM | POA: Diagnosis not present

## 2016-06-22 DIAGNOSIS — K5900 Constipation, unspecified: Secondary | ICD-10-CM | POA: Diagnosis not present

## 2016-06-22 DIAGNOSIS — N390 Urinary tract infection, site not specified: Secondary | ICD-10-CM | POA: Diagnosis not present

## 2016-06-22 DIAGNOSIS — B370 Candidal stomatitis: Secondary | ICD-10-CM

## 2016-06-22 LAB — CBC AND DIFFERENTIAL
HEMATOCRIT: 31 % — AB (ref 36–46)
HEMOGLOBIN: 9.6 g/dL — AB (ref 12.0–16.0)
NEUTROS ABS: 6 /uL
PLATELETS: 353 10*3/uL (ref 150–399)
WBC: 9.2 10*3/mL

## 2016-06-22 LAB — HEPATIC FUNCTION PANEL
ALK PHOS: 92 U/L (ref 25–125)
ALT: 29 U/L (ref 7–35)
AST: 69 U/L — AB (ref 13–35)
Bilirubin, Total: 1.2 mg/dL

## 2016-06-22 LAB — BASIC METABOLIC PANEL
BUN: 1 mg/dL — AB (ref 4–21)
CREATININE: 0.7 mg/dL (ref 0.5–1.1)
Glucose: 90 mg/dL
Potassium: 4.9 mmol/L (ref 3.4–5.3)
Sodium: 137 mmol/L (ref 137–147)

## 2016-06-22 NOTE — Progress Notes (Signed)
LOCATION: [place]  PCP: Maryruth Eve, MD   Code Status: DNR  Goals of care: Advanced Directive information Advanced Directives 06/21/2016  Does patient have an advance directive? Yes  Type of Advance Directive Out of facility DNR (pink MOST or yellow form)  Does patient want to make changes to advanced directive? No - Patient declined  Copy of advanced directive(s) in chart? Yes  Would patient like information on creating an advanced directive? -  Pre-existing out of facility DNR order (yellow form or pink MOST form) -       Extended Emergency Contact Information Primary Emergency Contact: Lemons,Angel Address: [place], [place] [address] [place] Phone: [phone] Mobile Phone: [phone] Relation: Daughter   Allergies  Allergen Reactions  . Albuterol Sulfate     tachycardia    Chief Complaint  Patient presents with  . New Admit To SNF    New Admission     HPI:  Patient is a 70 y.o. female seen today for short term rehabilitation post hospital admission from 06/14/16-06/19/16 post fall with left hip pain. She sustained left intertrochanteric fracture and underwent ORIF on 06/15/16. She had COPD exacerbation and was treated for this and is now on oxygen. She had ABLA and required 1 u prbc transfusion. She was also treated for UTI this admission. She is seen in her room today.   Review of Systems:  Constitutional: Negative for fever, chills, diaphoresis. Feels weak and tired. HENT: Negative for headache, congestion, difficulty swallowing. Positive for nasal discharge.  Eyes: Negative for blurred vision, double vision and discharge.  Respiratory: Negative for shortness of breath and wheezing. Positive for chronic cough Cardiovascular: Negative for chest pain, palpitations, leg swelling.  Gastrointestinal: Negative for heartburn, nausea, vomiting, abdominal pain. Last bowel movement was 2 days ago.  Genitourinary:  Negative for dysuria and flank pain.  Musculoskeletal: Negative for back pain, fall in the facility. pain medication has been helping with her left hip pain.  Skin: Negative for itching, rash.  Neurological: Negative for dizziness. Psychiatric/Behavioral: Negative for depression.   Past Medical History:  Diagnosis Date  . COPD (chronic obstructive pulmonary disease) ([place])   . Lung mass   . Pleurisy 2008   left lung   Past Surgical History:  Procedure Laterality Date  . INTRAMEDULLARY (IM) NAIL INTERTROCHANTERIC Left 06/15/2016   Procedure: INTRAMEDULLARY (IM) NAIL INTERTROCHANTRIC FRACTURE LEFT;  Surgeon: Leandrew Koyanagi, MD;  Location: [facility];  Service: Orthopedics;  Laterality: Left;  . TUBAL LIGATION  1986   Social History:   reports that she has been smoking Pipe.  She has never used smokeless tobacco. She reports that she does not drink alcohol or use drugs.  Family History  Problem Relation Age of Onset  . Asthma    . Heart disease Mother   . Cancer Father   . Pancreatic cancer      Medications:   Medication List       Accurate as of 06/22/16  2:44 PM. Always use your most recent med list.          acetaminophen 325 MG tablet Commonly known as:  TYLENOL Take 2 tablets (650 mg total) by mouth every 6 (six) hours as needed for mild pain (or Fever >/= 101).   aspirin 81 MG chewable tablet Chew 81 mg by mouth daily.   atorvastatin 10 MG tablet Commonly known as:  LIPITOR Take 10 mg by  mouth daily.   BREO ELLIPTA 100-25 MCG/INH Aepb Generic drug:  fluticasone furoate-vilanterol INHALE 1 PUFF INTO THE LUNGS DAILY. INS WILL PAY ON 03-24-15   enoxaparin 40 MG/0.4ML injection Commonly known as:  LOVENOX Inject 0.4 mLs (40 mg total) into the skin daily.   ferrous sulfate 325 (65 FE) MG tablet Take 1 tablet (325 mg total) by mouth 2 (two) times daily with a meal.   ipratropium 0.02 % nebulizer solution Commonly known as:  ATROVENT Take 2.5 mLs (0.5 mg total) by  nebulization every 4 (four) hours as needed for wheezing or shortness of breath.   levalbuterol 45 MCG/ACT inhaler Commonly known as:  XOPENEX HFA INHALE 1-2 PUFFS INTO THE LUNGS EVERY 4 HRS AS NEEDED FOR WHEEZING   nystatin 100000 UNIT/ML suspension Commonly known as:  MYCOSTATIN Take 5 mLs by mouth 4 (four) times daily. Stop date 07/06/16   oxycodone 5 MG capsule Commonly known as:  OXY-IR Take 5 mg by mouth every 4 (four) hours as needed for pain (Take 1-3 tablets).   polyethylene glycol packet Commonly known as:  MIRALAX / GLYCOLAX Take 17 g by mouth daily as needed for mild constipation.   potassium chloride SA 20 MEQ tablet Commonly known as:  K-DUR,KLOR-CON Take 40 mEq by mouth 2 (two) times daily.   RA VITAMIN B-12 TR 1000 MCG Tbcr Generic drug:  Cyanocobalamin Take 1 tablet by mouth daily.   senna 8.6 MG Tabs tablet Commonly known as:  SENOKOT Take 1 tablet (8.6 mg total) by mouth daily as needed for mild constipation.   SPIRIVA RESPIMAT 2.5 MCG/ACT Aers Generic drug:  Tiotropium Bromide Monohydrate INHALE 2 PUFFS INTO THE LUNGS DAILY.   traMADol 50 MG tablet Commonly known as:  ULTRAM Take 1 tablet (50 mg total) by mouth every 6 (six) hours as needed for moderate pain.   UNABLE TO FIND Med Name: MedPass 120 mL po BID for nutritional supplement   Vitamin D (Ergocalciferol) 50000 units Caps capsule Commonly known as:  DRISDOL TAKE 50,000 UNITS BY MOUTH EVERY 7 DAYS.       Immunizations: Immunization History  Administered Date(s) Administered  . Influenza Split 10/04/2011, 09/15/2012, 07/21/2014  . Influenza,inj,Quad PF,36+ Mos 08/19/2015  . Pneumococcal Conjugate-13 12/18/2014     Physical Exam: Vitals:   06/22/16 1404  BP: (!) 149/68  Pulse: 83  Resp: 18  Temp: 97.7 F (36.5 C)  TempSrc: Oral  SpO2: 90%  Weight: 130 lb (59 kg)  Height: '5\' 9"'$  (1.753 m)   Body mass index is 19.2 kg/m.  General- elderly female, frail and thin built, in no  acute distress Head- normocephalic, atraumatic Nose- no maxillary or frontal sinus tenderness, no nasal discharge Throat- moist mucus membrane, has dentures Eyes- PERRLA, EOMI, no pallor, no icterus Neck- no cervical lymphadenopathy Cardiovascular- normal s1,s2, no murmur Respiratory- bilateral poor air netry, no wheeze, no rhonchi, no crackles, no use of accessory muscles, on 3 l o2 by nasal canula Abdomen- bowel sounds present, soft, non tender Musculoskeletal- able to move all 4 extremities, no leg edema, weakness to both her legs and limited ROM to left hip Neurological- alert and oriented to person, place and time Skin- warm and dry, surgical incision to left thigh with staples in place and dry dressing, surgical incision to left hip area with mepilex dressing Psychiatry- normal mood and affect    Labs reviewed: Basic Metabolic Panel:  Recent Labs  06/17/16 0428 06/18/16 1832 06/19/16 0953  NA 132* 137 135  K 3.5 3.1* 3.1*  CL 100* 103 101  CO2 21* 28 27  GLUCOSE 108* 110* 109*  BUN 8 6 5*  CREATININE 0.91 0.73 0.77  CALCIUM 7.8* 7.8* 8.0*    CBC:  Recent Labs  06/14/16 1830  06/16/16 0611 06/17/16 0428 06/18/16 0316 06/19/16 0953  WBC 11.5*  < > 9.9 12.5* 8.2  --   NEUTROABS 9.7*  --   --   --   --   --   HGB 11.6*  < > 8.9* 8.6* 7.3* 9.2*  HCT 34.6*  < > 27.6* 26.4* 22.1* 28.7*  MCV 89.6  < > 90.8 89.8 89.8  --   PLT 221  < > 180 200 190  --   < > = values in this interval not displayed.   Radiological Exams: Dg Chest 1 View  Result Date: 06/14/2016 CLINICAL DATA:  Peri-op, hip fracture. Hx of COPD, lung mass. Current every day smoker. EXAM: CHEST 1 VIEW COMPARISON:  None. FINDINGS: Lungs are hyperinflated. There is perihilar peribronchial thickening. Biapical pleural parenchymal thickening is identified, right greater than left and appears chronic. No focal consolidations or pleural effusions. No pulmonary edema. IMPRESSION: Hyperinflation and bronchitic  change. No evidence for acute pulmonary abnormality. Electronically Signed   By: Nolon Nations M.D.   On: 06/14/2016 19:12   Ct Chest W Contrast  Result Date: 06/18/2016 CLINICAL DATA:  Hypoxia.  History of pleurisy, lung mass and COPD. EXAM: CT CHEST WITH CONTRAST TECHNIQUE: Multidetector CT imaging of the chest was performed during intravenous contrast administration. CONTRAST:  4m ISOVUE-300 IOPAMIDOL (ISOVUE-300) INJECTION 61% COMPARISON:  Chest CT dated 10/19/2014. FINDINGS: Cardiovascular: Thoracic aorta is normal in caliber and configuration. No aortic aneurysm or dissection. Heart size is normal. No pericardial effusion. Mediastinum/Nodes: Subtle soft tissue within the aortopulmonary window region, markedly decreased compared to the previous exam. Scattered small lymph nodes in the mediastinum and right hilum. Secretions within the lower portion of the trachea. Lungs/Pleura: Bilateral emphysematous change, moderate to severe in degree, progressed since the previous study. Associated interstitial fibrosis within the lower lobes bilaterally. Scarring in the left upper lobe. Confluent scarring/fibrosis within the lung apices. The large left upper lobe mass seen on previous CT is resolved. Patchy small consolidations at the left lung base posteriorly, most likely atelectasis. Small left pleural effusion. Trace right pleural effusion. Upper Abdomen: Limited images of the upper abdomen are unremarkable. Musculoskeletal: Mild degenerative change within the kyphotic thoracic spine. No acute or suspicious osseous finding. Superficial soft tissues are unremarkable. IMPRESSION: 1. Diffuse bilateral emphysematous change, moderate to severe in degree, progressed since the previous CT of 10/20/2011. 2. Mild scarring in the left upper lobe. Additional confluent scarring/fibrosis at each lung apex. The large left upper lobe mass demonstrated on the earlier chest CT has resolved. Subtle soft tissue thickening again  noted in the aorta pulmonary window region, also markedly decreased, possibly post treatment change. 3. Small left pleural effusion and trace right pleural effusion. 4. Patchy small consolidations at the left lung base are most likely atelectasis, pneumonia less likely. 5. Heart size is normal.  No aortic aneurysm or dissection. Electronically Signed   By: SFranki CabotM.D.   On: 06/18/2016 23:39   Dg Chest Port 1 View  Result Date: 06/18/2016 CLINICAL DATA:  Hypoxia EXAM: PORTABLE CHEST 1 VIEW COMPARISON:  06/14/2016 FINDINGS: The cardiac shadow is within normal limits. The lungs are well aerated bilaterally. A nodular density is noted in the right mid lung which  was not as well appreciated on the prior exam. This may be related to confluence of parenchymal shadows. No other focal abnormality is noted. IMPRESSION: Nodular appearing density in the right lung. This was not well appreciated on the recent exam and may be artifactual in nature. CT is recommended for are further evaluation. Electronically Signed   By: Inez Catalina M.D.   On: 06/18/2016 13:44   Dg C-arm 1-60 Min  Result Date: 06/15/2016 CLINICAL DATA:  Lt femur IM nail intertrochanteric Fracture fluoro time 10mn 30 seconds EXAM: LEFT FEMUR PORTABLE 2 VIEWS; DG C-ARM 61-120 MIN COMPARISON:  06/14/2016 FINDINGS: Images demonstrate proximal and distal extent of IM nail fixation of left intertrochanteric fracture. Two lag screws traverse the femoral neck. No interval fractures are identified. No evidence for subluxation or dislocation. IMPRESSION: Status post ORIF of left intertrochanteric fracture Electronically Signed   By: ENolon NationsM.D.   On: 06/15/2016 08:32   Dg Hip Unilat With Pelvis 2-3 Views Left  Result Date: 06/14/2016 CLINICAL DATA:  Pt states she fell at home today onto left hip. Left hip pain since then. EXAM: DG HIP (WITH OR WITHOUT PELVIS) 2-3V LEFT COMPARISON:  None. FINDINGS: There is not intertrochanteric fracture of the  left hip with little displacement or angulation. No dislocation or subluxation. IMPRESSION: Intertrochanteric fracture of the left hip. Electronically Signed   By: ENolon NationsM.D.   On: 06/14/2016 18:00   Dg Femur Port Min 2 Views Left  Result Date: 06/15/2016 CLINICAL DATA:  Lt femur IM nail intertrochanteric Fracture fluoro time 180m 30 seconds EXAM: LEFT FEMUR PORTABLE 2 VIEWS; DG C-ARM 61-120 MIN COMPARISON:  06/14/2016 FINDINGS: Images demonstrate proximal and distal extent of IM nail fixation of left intertrochanteric fracture. Two lag screws traverse the femoral neck. No interval fractures are identified. No evidence for subluxation or dislocation. IMPRESSION: Status post ORIF of left intertrochanteric fracture Electronically Signed   By: ElNolon Nations.D.   On: 06/15/2016 08:32    Assessment/Plan  Unsteady gait Will have patient work with PT/OT as tolerated to regain strength and restore function.  Fall precautions are in place.  Physical deconditioning Will have her work with physical therapy and occupational therapy team to help with gait training and muscle strengthening exercises.fall precautions. Skin care. Encourage to be out of bed.   Left hip intertrochanteric fracture S/p ORIF. Has orthopedic follow up. To work with PT and OT for gait strengthening exercises. Continue oxyIR 5 mg q4h prn pain and tylenol 650 mg q6h prn pain. Continue tramadol 50 mg q6h prn pain. Continue lovenox injection daily for dvt prophylaxis  Blood loss anemia Post op, s/p 1 u prbc transfusion. Continue feso4 325 mg bid  UTI Completed iv rocephin, monitor clinically, hydration to be maintained  COPD With recent exacerbation. Continue oxygen at 3 l/min by nasal canula and wean off to room air as tolerated. Continue her bronchodilator treatment  Hypokalemia On kcl supplement, check bmp  Protein calorie malnutrition Continue medpass bid and get RD to evaluate  Constipation Has chronic  constipation and had bowel movement once a week at home. Continue miralax daily as needed and change her senokot to 1 tab daily.   Vitamin d def Continue vitamin d 50,000 iu once a week  Oral thrush Continue nystatin oral suspension and monitor   Goals of care: short term rehabilitation   Labs/tests ordered: cbc, bmp  Family/ staff Communication: reviewed care plan with patient and nursing supervisor    [facility]  Bubba Camp, MD Internal Medicine [facility] [address] [place], [place] [address] Cell Phone (Monday-Friday 8 am - 5 pm): [phone] On Call: [phone] and follow prompts after 5 pm and on weekends Office Phone: [phone] Office Fax: [phone]

## 2016-06-23 ENCOUNTER — Encounter: Payer: Self-pay | Admitting: Adult Health

## 2016-06-23 ENCOUNTER — Non-Acute Institutional Stay (SKILLED_NURSING_FACILITY): Payer: Medicare Other | Admitting: Adult Health

## 2016-06-23 DIAGNOSIS — E43 Unspecified severe protein-calorie malnutrition: Secondary | ICD-10-CM | POA: Diagnosis not present

## 2016-06-23 DIAGNOSIS — D62 Acute posthemorrhagic anemia: Secondary | ICD-10-CM

## 2016-06-23 DIAGNOSIS — E876 Hypokalemia: Secondary | ICD-10-CM | POA: Diagnosis not present

## 2016-06-23 NOTE — Progress Notes (Signed)
Patient ID: Karen Coffey, female   DOB: 01-06-1946, 70 y.o.   MRN: [id number]    DATE:  06/23/2016   MRN:  [id number]  BIRTHDAY: August 12, 1946  Facility:  Nursing Home Location:  [place] Room Number: 322-G  LEVEL OF CARE:  SNF (31)  Contact Information    Name Relation Home Work [place] Daughter [phone]  [phone]       Code Status History    Date Active Date Inactive Code Status Order ID Comments User Context   06/14/2016  7:54 PM 06/19/2016  5:00 PM Full Code [id number]  Vianne Bulls, MD ED    Advance Directive Documentation   Flowsheet Row Most Recent Value  Type of Advance Directive  Out of facility DNR (pink MOST or yellow form)  Pre-existing out of facility DNR order (yellow form or pink MOST form)  No data  "MOST" Form in Place?  No data       Chief Complaint  Patient presents with  . Acute Visit    Protein calorie malnutrition, hypokalemia, severe anemia    HISTORY OF PRESENT ILLNESS:  This is a 70 year old female who is being seen for an acute visit due to albumin 2.96 . She is currently on Med pass 120 ml BID for supplementation. Latest K 4.9 and currently on KCL ER 40 meq BID. Latest hgb 9.6. She had a recent blood transfusion while in the hospital.  She has been admitted to [facility] on 06/19/16 from [facility]. She has PMH of COPD, non-small cell lung cancer and on clinical remission since October 2015. She had a fall and sustained a closed left hip fracture for which she had ORIF/intramedullary implant on 06/15/16. Her hemoglobin dropped to 7.3 (has baseline hemoglobin around 12) and was transfused with 1 unit packed RBC.   She has been admitted for a short-term rehabilitation.  PAST MEDICAL HISTORY:  Past Medical History:  Diagnosis Date  . Acute blood loss anemia   . Closed intertrochanteric fracture of left hip ([place]) 05/2016  . COPD (chronic obstructive pulmonary disease) ([place])   .  Leukocytosis 05/2016  . Lung mass   . Pleurisy 2008   left lung  . Tachycardia   . UTI (lower urinary tract infection)      CURRENT MEDICATIONS: Reviewed  Patient's Medications  New Prescriptions   No medications on file  Previous Medications   ACETAMINOPHEN (TYLENOL) 325 MG TABLET    Take 2 tablets (650 mg total) by mouth every 6 (six) hours as needed for mild pain (or Fever >/= 101).   ASPIRIN 81 MG CHEWABLE TABLET    Chew 81 mg by mouth daily.    ATORVASTATIN (LIPITOR) 10 MG TABLET    Take 10 mg by mouth daily.    BREO ELLIPTA 100-25 MCG/INH AEPB    INHALE 1 PUFF INTO THE LUNGS DAILY. INS WILL PAY ON 03-24-15   CYANOCOBALAMIN (RA VITAMIN B-12 TR) 1000 MCG TBCR    Take 1 tablet by mouth daily.    ENOXAPARIN (LOVENOX) 40 MG/0.4ML INJECTION    Inject 0.4 mLs (40 mg total) into the skin daily.   FERROUS SULFATE 325 (65 FE) MG TABLET    Take 1 tablet (325 mg total) by mouth 2 (two) times daily with a meal.   IPRATROPIUM (ATROVENT) 0.02 % NEBULIZER SOLUTION    Take 2.5 mLs (0.5 mg total) by nebulization every 4 (four) hours as needed for wheezing or  shortness of breath.   LEVALBUTEROL (XOPENEX HFA) 45 MCG/ACT INHALER    INHALE 1-2 PUFFS INTO THE LUNGS EVERY 4 HRS AS NEEDED FOR WHEEZING   NYSTATIN (MYCOSTATIN) 100000 UNIT/ML SUSPENSION    Take 5 mLs by mouth 4 (four) times daily. Stop date 07/06/16    OXYCODONE (OXY-IR) 5 MG CAPSULE    Take 5-15 mg by mouth every 4 (four) hours as needed for pain (Take 1-3 tablets).    POLYETHYLENE GLYCOL (MIRALAX / GLYCOLAX) PACKET    Take 17 g by mouth daily as needed for mild constipation.   PROTEIN (PROCEL) POWD    Take 1 scoop by mouth 2 (two) times daily.   SENNA (SENOKOT) 8.6 MG TABS TABLET    Take 1 tablet (8.6 mg total) by mouth daily as needed for mild constipation.   TIOTROPIUM BROMIDE MONOHYDRATE (SPIRIVA RESPIMAT) 2.5 MCG/ACT AERS    INHALE 2 PUFFS INTO THE LUNGS DAILY.   TRAMADOL (ULTRAM) 50 MG TABLET    Take 1 tablet (50 mg total) by mouth every  6 (six) hours as needed for moderate pain.   UNABLE TO FIND    Med Name: MedPass 120 mL po BID for nutritional supplement   VITAMIN D, ERGOCALCIFEROL, (DRISDOL) 50000 UNITS CAPS CAPSULE    TAKE 50,000 UNITS BY MOUTH EVERY 7 DAYS.  Modified Medications   No medications on file  Discontinued Medications   POTASSIUM CHLORIDE SA (K-DUR,KLOR-CON) 20 MEQ TABLET    Take 40 mEq by mouth 2 (two) times daily.     Allergies  Allergen Reactions  . Albuterol Sulfate     tachycardia     REVIEW OF SYSTEMS:  GENERAL: no change in appetite, no fatigue, no weight changes, no fever, chills or weakness EYES: Denies change in vision, dry eyes, eye pain, itching or discharge EARS: Denies change in hearing, ringing in ears, or earache NOSE: Denies nasal congestion or epistaxis MOUTH and THROAT: complains of oral pain RESPIRATORY: no cough, SOB, DOE, wheezing, hemoptysis CARDIAC: no chest pain, edema or palpitations GI: no abdominal pain, diarrhea, constipation, heart burn, nausea or vomiting GU: Denies dysuria, frequency, hematuria, incontinence, or discharge PSYCHIATRIC: Denies feeling of depression or anxiety. No report of hallucinations, insomnia, paranoia, or agitation    PHYSICAL EXAMINATION  GENERAL APPEARANCE:  In no acute distress. Thin body habitus SKIN:  Left  hip  has 2 surgical incisions with staples and covered with dry dressing, no erythema HEAD: Normal in size and contour. No evidence of trauma EYES: Lids open and close normally. No blepharitis, entropion or ectropion. PERRL. Conjunctivae are clear and sclerae are white. Lenses are without opacity EARS: Pinnae are normal. Patient hears normal voice tunes of the examiner MOUTH and THROAT: Lips are without lesions. Has lesion on tongue.  NECK: supple, trachea midline, no neck masses, no thyroid tenderness, no thyromegaly LYMPHATICS: no LAN in the neck, no supraclavicular LAN RESPIRATORY: breathing is even & unlabored, BS CTAB; has O2  @ 3L/min continuously CARDIAC: RRR, no murmur,no extra heart sounds, no edema GI: abdomen soft, normal BS, no masses, no tenderness, no hepatomegaly, no splenomegaly EXTREMITIES:  Able to move 4 extremities PSYCHIATRIC: Alert and oriented X 3. Affect and behavior are appropriate  LABS/RADIOLOGY: Labs reviewed: Basic Metabolic Panel:  Recent Labs  06/17/16 0428 06/18/16 1832 06/19/16 0953 06/22/16 1254  NA 132* 137 135 137  K 3.5 3.1* 3.1* 4.9  CL 100* 103 101  --   CO2 21* 28 27  --   GLUCOSE  108* 110* 109*  --   BUN 8 6 5* 1*  CREATININE 0.91 0.73 0.77 0.7  CALCIUM 7.8* 7.8* 8.0*  --    CBC:  Recent Labs  06/14/16 1830  06/16/16 0611 06/17/16 0428 06/18/16 0316 06/19/16 0953 06/22/16 1254  WBC 11.5*  < > 9.9 12.5* 8.2  --  9.2  NEUTROABS 9.7*  --   --   --   --   --  6  HGB 11.6*  < > 8.9* 8.6* 7.3* 9.2* 9.6*  HCT 34.6*  < > 27.6* 26.4* 22.1* 28.7* 31*  MCV 89.6  < > 90.8 89.8 89.8  --   --   PLT 221  < > 180 200 190  --  353  < > = values in this interval not displayed.   Dg Chest 1 View  Result Date: 06/14/2016 CLINICAL DATA:  Peri-op, hip fracture. Hx of COPD, lung mass. Current every day smoker. EXAM: CHEST 1 VIEW COMPARISON:  None. FINDINGS: Lungs are hyperinflated. There is perihilar peribronchial thickening. Biapical pleural parenchymal thickening is identified, right greater than left and appears chronic. No focal consolidations or pleural effusions. No pulmonary edema. IMPRESSION: Hyperinflation and bronchitic change. No evidence for acute pulmonary abnormality. Electronically Signed   By: Nolon Nations M.D.   On: 06/14/2016 19:12   Ct Chest W Contrast  Result Date: 06/18/2016 CLINICAL DATA:  Hypoxia.  History of pleurisy, lung mass and COPD. EXAM: CT CHEST WITH CONTRAST TECHNIQUE: Multidetector CT imaging of the chest was performed during intravenous contrast administration. CONTRAST:  41m ISOVUE-300 IOPAMIDOL (ISOVUE-300) INJECTION 61% COMPARISON:   Chest CT dated 10/19/2014. FINDINGS: Cardiovascular: Thoracic aorta is normal in caliber and configuration. No aortic aneurysm or dissection. Heart size is normal. No pericardial effusion. Mediastinum/Nodes: Subtle soft tissue within the aortopulmonary window region, markedly decreased compared to the previous exam. Scattered small lymph nodes in the mediastinum and right hilum. Secretions within the lower portion of the trachea. Lungs/Pleura: Bilateral emphysematous change, moderate to severe in degree, progressed since the previous study. Associated interstitial fibrosis within the lower lobes bilaterally. Scarring in the left upper lobe. Confluent scarring/fibrosis within the lung apices. The large left upper lobe mass seen on previous CT is resolved. Patchy small consolidations at the left lung base posteriorly, most likely atelectasis. Small left pleural effusion. Trace right pleural effusion. Upper Abdomen: Limited images of the upper abdomen are unremarkable. Musculoskeletal: Mild degenerative change within the kyphotic thoracic spine. No acute or suspicious osseous finding. Superficial soft tissues are unremarkable. IMPRESSION: 1. Diffuse bilateral emphysematous change, moderate to severe in degree, progressed since the previous CT of 10/20/2011. 2. Mild scarring in the left upper lobe. Additional confluent scarring/fibrosis at each lung apex. The large left upper lobe mass demonstrated on the earlier chest CT has resolved. Subtle soft tissue thickening again noted in the aorta pulmonary window region, also markedly decreased, possibly post treatment change. 3. Small left pleural effusion and trace right pleural effusion. 4. Patchy small consolidations at the left lung base are most likely atelectasis, pneumonia less likely. 5. Heart size is normal.  No aortic aneurysm or dissection. Electronically Signed   By: SFranki CabotM.D.   On: 06/18/2016 23:39   Dg Chest Port 1 View  Result Date:  06/18/2016 CLINICAL DATA:  Hypoxia EXAM: PORTABLE CHEST 1 VIEW COMPARISON:  06/14/2016 FINDINGS: The cardiac shadow is within normal limits. The lungs are well aerated bilaterally. A nodular density is noted in the right mid lung which was  not as well appreciated on the prior exam. This may be related to confluence of parenchymal shadows. No other focal abnormality is noted. IMPRESSION: Nodular appearing density in the right lung. This was not well appreciated on the recent exam and may be artifactual in nature. CT is recommended for are further evaluation. Electronically Signed   By: Inez Catalina M.D.   On: 06/18/2016 13:44   Dg C-arm 1-60 Min  Result Date: 06/15/2016 CLINICAL DATA:  Lt femur IM nail intertrochanteric Fracture fluoro time 78mn 30 seconds EXAM: LEFT FEMUR PORTABLE 2 VIEWS; DG C-ARM 61-120 MIN COMPARISON:  06/14/2016 FINDINGS: Images demonstrate proximal and distal extent of IM nail fixation of left intertrochanteric fracture. Two lag screws traverse the femoral neck. No interval fractures are identified. No evidence for subluxation or dislocation. IMPRESSION: Status post ORIF of left intertrochanteric fracture Electronically Signed   By: ENolon NationsM.D.   On: 06/15/2016 08:32   Dg Hip Unilat With Pelvis 2-3 Views Left  Result Date: 06/14/2016 CLINICAL DATA:  Pt states she fell at home today onto left hip. Left hip pain since then. EXAM: DG HIP (WITH OR WITHOUT PELVIS) 2-3V LEFT COMPARISON:  None. FINDINGS: There is not intertrochanteric fracture of the left hip with little displacement or angulation. No dislocation or subluxation. IMPRESSION: Intertrochanteric fracture of the left hip. Electronically Signed   By: ENolon NationsM.D.   On: 06/14/2016 18:00   Dg Femur Port Min 2 Views Left  Result Date: 06/15/2016 CLINICAL DATA:  Lt femur IM nail intertrochanteric Fracture fluoro time 143m 30 seconds EXAM: LEFT FEMUR PORTABLE 2 VIEWS; DG C-ARM 61-120 MIN COMPARISON:  06/14/2016  FINDINGS: Images demonstrate proximal and distal extent of IM nail fixation of left intertrochanteric fracture. Two lag screws traverse the femoral neck. No interval fractures are identified. No evidence for subluxation or dislocation. IMPRESSION: Status post ORIF of left intertrochanteric fracture Electronically Signed   By: ElNolon Nations.D.   On: 06/15/2016 08:32    ASSESSMENT/PLAN:  Anemia, acute blood loss - S/P transfusion of 1 unit packed RBC; continue ferrous sulfate 325 mg 1 tab by mouth twice a day; check CBC in 1 week Lab Results  Component Value Date   HGB 9.6 (A) 06/22/2016    Hypokalemia -  Latest K 4.9; discontinue KCL ER ; check BMP in 1 week  Protein-calorie malnutrition, severe - albumin 2.96; start Procel 2 scoops PO BID and continue Med pass 120 ml BID; RD consult; weigh weekly X 4 weeks     MoDurenda AgeNP [place]

## 2016-06-24 ENCOUNTER — Other Ambulatory Visit: Payer: Self-pay | Admitting: *Deleted

## 2016-06-24 MED ORDER — HYDROCODONE-ACETAMINOPHEN 5-325 MG PO TABS: ORAL_TABLET | ORAL | 0 refills | Status: DC

## 2016-06-24 NOTE — Telephone Encounter (Signed)
[facility] #[phone] Fax: [phone] 

## 2016-07-01 LAB — BASIC METABOLIC PANEL
BUN: 13 mg/dL (ref 4–21)
CREATININE: 0.7 mg/dL (ref 0.5–1.1)
GLUCOSE: 82 mg/dL
POTASSIUM: 4.3 mmol/L (ref 3.4–5.3)
Sodium: 136 mmol/L — AB (ref 137–147)

## 2016-07-01 LAB — CBC AND DIFFERENTIAL
HCT: 32 % — AB (ref 36–46)
Hemoglobin: 10.2 g/dL — AB (ref 12.0–16.0)
Neutrophils Absolute: 5 /uL
Platelets: 514 10*3/uL — AB (ref 150–399)
WBC: 8.3 10*3/mL

## 2016-07-05 ENCOUNTER — Encounter: Payer: Self-pay | Admitting: Family Medicine

## 2016-07-20 ENCOUNTER — Non-Acute Institutional Stay (SKILLED_NURSING_FACILITY): Payer: Medicare Other | Admitting: Adult Health

## 2016-07-20 ENCOUNTER — Encounter: Payer: Self-pay | Admitting: Adult Health

## 2016-07-20 DIAGNOSIS — E43 Unspecified severe protein-calorie malnutrition: Secondary | ICD-10-CM | POA: Diagnosis not present

## 2016-07-20 DIAGNOSIS — S72002D Fracture of unspecified part of neck of left femur, subsequent encounter for closed fracture with routine healing: Secondary | ICD-10-CM | POA: Diagnosis not present

## 2016-07-20 DIAGNOSIS — E559 Vitamin D deficiency, unspecified: Secondary | ICD-10-CM | POA: Diagnosis not present

## 2016-07-20 DIAGNOSIS — J449 Chronic obstructive pulmonary disease, unspecified: Secondary | ICD-10-CM | POA: Diagnosis not present

## 2016-07-20 DIAGNOSIS — R2681 Unsteadiness on feet: Secondary | ICD-10-CM | POA: Diagnosis not present

## 2016-07-20 DIAGNOSIS — D62 Acute posthemorrhagic anemia: Secondary | ICD-10-CM | POA: Diagnosis not present

## 2016-07-20 DIAGNOSIS — K5901 Slow transit constipation: Secondary | ICD-10-CM | POA: Diagnosis not present

## 2016-07-20 NOTE — Progress Notes (Signed)
Patient ID: Karen Coffey, female   DOB: 02-Dec-1945, 70 y.o.   MRN: [id number]    DATE:  07/20/16  MRN:  [id number]  BIRTHDAY: 17-Jan-1946  Facility:  Nursing Home Location:  [place] Room Number: 852-D  LEVEL OF CARE:  SNF (31)  Contact Information    Name Relation Home Work [place] Daughter [phone]  [phone]       Code Status History    Date Active Date Inactive Code Status Order ID Comments User Context   06/14/2016  7:54 PM 06/19/2016  5:00 PM Full Code [id number]  Vianne Bulls, MD ED    Advance Directive Documentation   Flowsheet Row Most Recent Value  Type of Advance Directive  Out of facility DNR (pink MOST or yellow form)  Pre-existing out of facility DNR order (yellow form or pink MOST form)  No data  "MOST" Form in Place?  No data       Chief Complaint  Patient presents with  . Discharge Note    HISTORY OF PRESENT ILLNESS:  This is a 70 year old female who is for discharge home with Home health PT, OT, CNA and Nursing. DME:  Standard wheelchair, cushion, anti-tippers, break extenders, bedside commode and rolling walker.  She has been admitted to [facility] on 06/19/16 from [facility]. She has PMH of COPD, non-small cell lung cancer and on clinical remission since October 2015. She had a fall and sustained a closed left hip fracture for which she had ORIF/intramedullary implant on 06/15/16. Her hemoglobin dropped to 7.3 (has baseline hemoglobin around 12) and was transfused with 1 unit packed RBC.   Patient was admitted to this facility for short-term rehabilitation after the patient's recent hospitalization.  Patient has completed SNF rehabilitation and therapy has cleared the patient for discharge.  PAST MEDICAL HISTORY:  Past Medical History:  Diagnosis Date  . Acute blood loss anemia   . Closed intertrochanteric fracture of left hip ([place]) 05/2016  . COPD (chronic obstructive pulmonary  disease) ([place])   . Leukocytosis 05/2016  . Lung mass   . Pleurisy 2008   left lung  . Tachycardia   . UTI (lower urinary tract infection)      CURRENT MEDICATIONS: Reviewed  Patient's Medications  New Prescriptions   No medications on file  Previous Medications   ACETAMINOPHEN (TYLENOL) 325 MG TABLET    Take 2 tablets (650 mg total) by mouth every 6 (six) hours as needed for mild pain (or Fever >/= 101).   ASPIRIN 81 MG CHEWABLE TABLET    Chew 81 mg by mouth daily.    BREO ELLIPTA 100-25 MCG/INH AEPB    INHALE 1 PUFF INTO THE LUNGS DAILY. INS WILL PAY ON 03-24-15   CYANOCOBALAMIN (RA VITAMIN B-12 TR) 1000 MCG TBCR    Take 1 tablet by mouth daily.    FERROUS SULFATE 325 (65 FE) MG TABLET    Take 1 tablet (325 mg total) by mouth 2 (two) times daily with a meal.   IPRATROPIUM (ATROVENT) 0.02 % NEBULIZER SOLUTION    Take 2.5 mLs (0.5 mg total) by nebulization every 4 (four) hours as needed for wheezing or shortness of breath.   LEVALBUTEROL (XOPENEX HFA) 45 MCG/ACT INHALER    INHALE 1-2 PUFFS INTO THE LUNGS EVERY 4 HRS AS NEEDED FOR WHEEZING   OXYCODONE (OXY-IR) 5 MG CAPSULE    Take 5-15 mg by mouth every 4 (four) hours as needed for  pain (Take 1-3 tablets).    POLYETHYLENE GLYCOL (MIRALAX / GLYCOLAX) PACKET    Take 17 g by mouth daily as needed for mild constipation.   PROTEIN (PROCEL) POWD    Take 2 scoop by mouth 2 (two) times daily.    SENNA (SENOKOT) 8.6 MG TABS TABLET    Take 1 tablet (8.6 mg total) by mouth daily as needed for mild constipation.   TIOTROPIUM BROMIDE MONOHYDRATE (SPIRIVA RESPIMAT) 2.5 MCG/ACT AERS    INHALE 2 PUFFS INTO THE LUNGS DAILY FOR COPD   UNABLE TO FIND    Med Name: MedPass 120 mL po BID for nutritional supplement   VITAMIN D, ERGOCALCIFEROL, (DRISDOL) 50000 UNITS CAPS CAPSULE    TAKE 50,000 UNITS BY MOUTH EVERY 7 DAYS.  Modified Medications   No medications on file  Discontinued Medications   ASPIRIN 325 MG TABLET    Take 325 mg by mouth 2 (two) times daily.    ATORVASTATIN (LIPITOR) 10 MG TABLET    Take 10 mg by mouth daily.    ENOXAPARIN (LOVENOX) 40 MG/0.4ML INJECTION    Inject 0.4 mLs (40 mg total) into the skin daily.   HYDROCODONE-ACETAMINOPHEN (NORCO/VICODIN) 5-325 MG TABLET    Take one tablet by mouth every 6 hours as needed for severe pain. Do not exceed 4gm of Tylenol in 24 hours   NYSTATIN (MYCOSTATIN) 100000 UNIT/ML SUSPENSION    Take 5 mLs by mouth 4 (four) times daily. Stop date 07/06/16    TRAMADOL (ULTRAM) 50 MG TABLET    Take 1 tablet (50 mg total) by mouth every 6 (six) hours as needed for moderate pain.     Allergies  Allergen Reactions  . Albuterol Sulfate     tachycardia     REVIEW OF SYSTEMS:  GENERAL: no change in appetite, no fatigue, no weight changes, no fever, chills or weakness EYES: Denies change in vision, dry eyes, eye pain, itching or discharge EARS: Denies change in hearing, ringing in ears, or earache NOSE: Denies nasal congestion or epistaxis MOUTH and THROAT: complains of oral pain RESPIRATORY: no cough, SOB, DOE, wheezing, hemoptysis CARDIAC: no chest pain, edema or palpitations GI: no abdominal pain, diarrhea, constipation, heart burn, nausea or vomiting GU: Denies dysuria, frequency, hematuria, incontinence, or discharge PSYCHIATRIC: Denies feeling of depression or anxiety. No report of hallucinations, insomnia, paranoia, or agitation    PHYSICAL EXAMINATION  GENERAL APPEARANCE:  In no acute distress. Thin body habitus SKIN:  Left  hip  has 2 surgical incisions which are healed HEAD: Normal in size and contour. No evidence of trauma EYES: Lids open and close normally. No blepharitis, entropion or ectropion. PERRL. Conjunctivae are clear and sclerae are white. Lenses are without opacity EARS: Pinnae are normal. Patient hears normal voice tunes of the examiner MOUTH and THROAT: Lips are without lesions. Has lesion on tongue.  NECK: supple, trachea midline, no neck masses, no thyroid tenderness, no  thyromegaly LYMPHATICS: no LAN in the neck, no supraclavicular LAN RESPIRATORY: breathing is even & unlabored, BS CTAB CARDIAC: RRR, no murmur,no extra heart sounds, no edema GI: abdomen soft, normal BS, no masses, no tenderness, no hepatomegaly, no splenomegaly EXTREMITIES:  Able to move 4 extremities PSYCHIATRIC: Alert and oriented X 3. Affect and behavior are appropriate  LABS/RADIOLOGY: Labs reviewed: Basic Metabolic Panel:  Recent Labs  06/17/16 0428 06/18/16 1832 06/19/16 0953 06/22/16 1254 07/01/16 1200  NA 132* 137 135 137 136*  K 3.5 3.1* 3.1* 4.9 4.3  CL 100* 103 101  --   --  CO2 21* 28 27  --   --   GLUCOSE 108* 110* 109*  --   --   BUN 8 6 5* 1* 13  CREATININE 0.91 0.73 0.77 0.7 0.7  CALCIUM 7.8* 7.8* 8.0*  --   --    CBC:  Recent Labs  06/14/16 1830  06/16/16 0611 06/17/16 0428 06/18/16 0316 06/19/16 0953 06/22/16 1254 07/01/16 1200  WBC 11.5*  < > 9.9 12.5* 8.2  --  9.2 8.3  NEUTROABS 9.7*  --   --   --   --   --  6 5  HGB 11.6*  < > 8.9* 8.6* 7.3* 9.2* 9.6* 10.2*  HCT 34.6*  < > 27.6* 26.4* 22.1* 28.7* 31* 32*  MCV 89.6  < > 90.8 89.8 89.8  --   --   --   PLT 221  < > 180 200 190  --  353 514*  < > = values in this interval not displayed.     ASSESSMENT/PLAN:  Unsteady gait - for Home health CNA, Nursing PT and OT, for therapeutic strengthening exercises; fall precaution  Closed left hip fracture S/P ORIF/intramedullary implant - for Home health CNA, Nursing PT and OT, for therapeutic strengthening exercises; follow-up with orthopedic surgeon, Dr Erlinda Hong; Gaynelle Arabian WBAT; continue ASA 325 mg 1 tab PO BID till 07/27/16 for DVT prophylaxis; oxycodone 5 mg 1-3 tabs by mouth every 4 hours when necessary, Tylenol 325 mg take 2 tabs = 650 mg by mouth every 6 hours when necessary for pain  Anemia, acute blood loss - S/P transfusion of 1 unit packed RBC; continue ferrous sulfate 325 mg 1 tab by mouth twice a day Lab Results  Component Value Date   HGB 10.2 (A)  07/01/2016    Hypokalemia - resolved Lab Results  Component Value Date   K 4.3 07/01/2016    Constipation - continue MiraLAX 17 g by mouth daily when necessary and senna 8.6 mg 1 tab by mouth daily   COPD - no SOB; continue Xopenex HFA 45 g inhaler inhale 1-2 puffs in 2 lungs every 4 hours when necessary, Atrovent  take 2.5 ML/0.5 mg via nebulizer every 4 hours when necessary, Spiriva Respimat 2.5 g INH inhale 2 puffs into the lungs daily and Breo Ellipta 100-25 mcg inhaler take 1 puff into the lungs daily  Vitamin D deficiency - continue vitamin D2 50,000 units 1 capsule by mouth every week  Protein-calorie malnutrition - albumin 2.96; continue Medpass 120 ml PO BID      I have filled out patient's discharge paperwork and written prescriptions.  Patient will receive home health PT, OT, Nursing and CNA.  DME provided:  Standard wheelchair, cushion, anti-tippers, break extenders, bedside commode and rolling walker  Total discharge time: Greater than 30 minutes Greater than 50% was spent in counseling and coordination of care with the patient.   Discharge time involved coordination of the discharge process with social worker, nursing staff and therapy department. Medical justification for home health services/DME verified.    Durenda Age, NP [employer] [phone]

## 2016-08-06 ENCOUNTER — Other Ambulatory Visit: Payer: Self-pay | Admitting: Adult Health

## 2016-08-25 ENCOUNTER — Emergency Department (HOSPITAL_COMMUNITY)
Admission: EM | Admit: 2016-08-25 | Discharge: 2016-08-25 | Disposition: A | Discharge status: Home / Self Care | Payer: Medicare Other | Attending: Emergency Medicine | Admitting: Emergency Medicine

## 2016-08-25 ENCOUNTER — Emergency Department (HOSPITAL_COMMUNITY): Payer: Medicare Other

## 2016-08-25 ENCOUNTER — Encounter (HOSPITAL_COMMUNITY): Payer: Self-pay | Admitting: Emergency Medicine

## 2016-08-25 DIAGNOSIS — J449 Chronic obstructive pulmonary disease, unspecified: Secondary | ICD-10-CM | POA: Insufficient documentation

## 2016-08-25 DIAGNOSIS — W1839XA Other fall on same level, initial encounter: Secondary | ICD-10-CM | POA: Insufficient documentation

## 2016-08-25 DIAGNOSIS — F17200 Nicotine dependence, unspecified, uncomplicated: Secondary | ICD-10-CM | POA: Insufficient documentation

## 2016-08-25 DIAGNOSIS — S4991XA Unspecified injury of right shoulder and upper arm, initial encounter: Secondary | ICD-10-CM | POA: Diagnosis present

## 2016-08-25 DIAGNOSIS — Y999 Unspecified external cause status: Secondary | ICD-10-CM | POA: Insufficient documentation

## 2016-08-25 DIAGNOSIS — S42001A Fracture of unspecified part of right clavicle, initial encounter for closed fracture: Secondary | ICD-10-CM | POA: Insufficient documentation

## 2016-08-25 DIAGNOSIS — S42021A Displaced fracture of shaft of right clavicle, initial encounter for closed fracture: Secondary | ICD-10-CM

## 2016-08-25 DIAGNOSIS — Y939 Activity, unspecified: Secondary | ICD-10-CM | POA: Insufficient documentation

## 2016-08-25 DIAGNOSIS — Z79899 Other long term (current) drug therapy: Secondary | ICD-10-CM | POA: Diagnosis not present

## 2016-08-25 DIAGNOSIS — Y929 Unspecified place or not applicable: Secondary | ICD-10-CM | POA: Diagnosis not present

## 2016-08-25 DIAGNOSIS — W19XXXA Unspecified fall, initial encounter: Secondary | ICD-10-CM

## 2016-08-25 MED ORDER — MORPHINE SULFATE (PF) 4 MG/ML IV SOLN
4.0000 mg | Freq: Once | INTRAVENOUS | Status: DC
  Filled 2016-08-25: qty 1

## 2016-08-25 MED ORDER — HYDROCODONE-ACETAMINOPHEN 5-325 MG PO TABS: 0.5000 | ORAL_TABLET | Freq: Four times a day (QID) | ORAL | 0 refills | Status: DC | PRN

## 2016-08-25 MED ORDER — MORPHINE SULFATE (PF) 4 MG/ML IV SOLN
4.0000 mg | Freq: Once | INTRAVENOUS | Status: AC
  Administered 2016-08-25: 4 mg via INTRAVENOUS
  Filled 2016-08-25: qty 1

## 2016-08-25 MED ORDER — ONDANSETRON HCL 4 MG/2ML IJ SOLN
4.0000 mg | INTRAMUSCULAR | Status: AC
  Administered 2016-08-25: 4 mg via INTRAVENOUS
  Filled 2016-08-25: qty 2

## 2016-08-25 NOTE — ED Notes (Signed)
MD at bedside. 

## 2016-08-25 NOTE — Progress Notes (Signed)
Orthopedic Tech Progress Note Patient Details:  Karen Coffey [birth year] [id number]  Ortho Devices Type of Ortho Device: Shoulder immobilizer Ortho Device/Splint Location: RUE Ortho Device/Splint Interventions: Ordered, Application   Braulio Bosch 08/25/2016, 5:25 PM

## 2016-08-25 NOTE — ED Notes (Signed)
Patient transported to X-ray 

## 2016-08-25 NOTE — ED Notes (Signed)
Pt and son verbalized understanding of d/c instructions and has no further questions. Pt stable and NAD

## 2016-08-25 NOTE — ED Triage Notes (Signed)
Pt in from home via [facility] after fall with walker, tripping and injuring R clavicle. Pt states she heard "snap" when she fell, has been unable to move shoulder s/p fall. Fingers warm, pink, +2 radial pulse. Pt didn't hit head, no LOC, not on blood thinners. EMS gave 262mg Fentanyl en route. Also has skin tear to R elbow. Alert, a&ox4, VSS

## 2016-08-25 NOTE — ED Provider Notes (Signed)
[facility] DEPT Provider Note   CSN: [id number] Arrival date & time: 08/25/16  1443     History   Chief Complaint Chief Complaint  Patient presents with  . Fall  . Clavicle Injury    HPI Karen Coffey is a 70 y.o. female.  Karen Coffey is a 70 y.o. Female who presents to the ED via EMS after a trip and fall prior to arrival. Patient reports she was standing up to walk outside and slipped with her walker falling on her right shoulder. She complains of pain to her right clavicle area and feels she cannot move her right shoulder. She denies hitting her head or LOC. She denies neck or back pain. She denies prodromal symptoms prior to falling and states that she tripped. She is not on anticoagulants. She denies fevers, numbness, tingling, weakness, neck pain, back pain, chest pain, shortness of breath, abdominal pain, nausea, vomiting, lightheadedness, dizziness, headache.    The history is provided by the patient and a relative. No language interpreter was used.  Fall  Pertinent negatives include no chest pain, no abdominal pain, no headaches and no shortness of breath.    Past Medical History:  Diagnosis Date  . Acute blood loss anemia   . Closed intertrochanteric fracture of left hip ([place]) 05/2016  . COPD (chronic obstructive pulmonary disease) ([place])   . Leukocytosis 05/2016  . Lung mass   . Pleurisy 2008   left lung  . Tachycardia   . UTI (lower urinary tract infection)     Patient Active Problem List   Diagnosis Date Noted  . Closed left hip fracture ([place]) 06/14/2016  . Normocytic anemia 06/14/2016  . Leukocytosis 06/14/2016  . Hip fracture ([place]) 06/14/2016  . Chronic obstructive pulmonary disease ([place])   . COPD exacerbation ([place]) 02/25/2015  . COPD, moderate ([place]) 12/18/2014  . COPD, severe ([place]) 03/26/2013  . Lung mass 11/02/2011  . Dyspnea 11/02/2011  . Cough 11/02/2011  . Tachycardia 11/02/2011  . Smoking 11/02/2011  . Cachexia ([place]) 11/02/2011  . Chest  pain, musculoskeletal 11/02/2011    Past Surgical History:  Procedure Laterality Date  . INTRAMEDULLARY (IM) NAIL INTERTROCHANTERIC Left 06/15/2016   Procedure: INTRAMEDULLARY (IM) NAIL INTERTROCHANTRIC FRACTURE LEFT;  Surgeon: Leandrew Koyanagi, MD;  Location: [facility];  Service: Orthopedics;  Laterality: Left;  . TUBAL LIGATION  1986    OB History    No data available       Home Medications    Prior to Admission medications   Medication Sig Start Date End Date Taking? Authorizing Provider  acetaminophen (TYLENOL) 325 MG tablet Take 2 tablets (650 mg total) by mouth every 6 (six) hours as needed for mild pain (or Fever >/= 101). 06/19/16  Yes Hosie Poisson, MD  baclofen (LIORESAL) 10 MG tablet Take 10 mg by mouth daily.   Yes Historical Provider, MD  budesonide-formoterol (SYMBICORT) 80-4.5 MCG/ACT inhaler Inhale 2 puffs into the lungs 2 (two) times daily.   Yes Historical Provider, MD  Cyanocobalamin (RA VITAMIN B-12 TR) 1000 MCG TBCR Take 1 tablet by mouth daily.  05/24/16  Yes Historical Provider, MD  ibuprofen (ADVIL,MOTRIN) 200 MG tablet Take 400 mg by mouth daily.   Yes Historical Provider, MD  ipratropium (ATROVENT) 0.02 % nebulizer solution Take 2.5 mLs (0.5 mg total) by nebulization every 4 (four) hours as needed for wheezing or shortness of breath. 06/19/16  Yes Hosie Poisson, MD  levalbuterol (XOPENEX HFA) 45 MCG/ACT inhaler INHALE 1-2 PUFFS INTO THE LUNGS EVERY  4 HRS AS NEEDED FOR WHEEZING 11/27/15  Yes Brand Males, MD  ondansetron (ZOFRAN-ODT) 4 MG disintegrating tablet Take 4 mg by mouth every 6 (six) hours as needed for nausea.  08/03/16  Yes Historical Provider, MD  polyethylene glycol (MIRALAX / GLYCOLAX) packet Take 17 g by mouth daily as needed for mild constipation. 06/19/16  Yes Hosie Poisson, MD  Tiotropium Bromide Monohydrate (SPIRIVA RESPIMAT) 2.5 MCG/ACT AERS INHALE 2 PUFFS INTO THE LUNGS DAILY FOR COPD 03/27/16  Yes Historical Provider, MD  Vitamin D, Ergocalciferol, (DRISDOL)  50000 units CAPS capsule TAKE 50,000 UNITS BY MOUTH EVERY 7 DAYS, FRIDAYS 05/25/16  Yes Historical Provider, MD  BREO ELLIPTA 100-25 MCG/INH AEPB INHALE 1 PUFF EVERY DAY Patient not taking: Reported on 08/25/2016 08/06/16   Monina C Medina-Vargas, NP  ferrous sulfate 325 (65 FE) MG tablet Take 1 tablet (325 mg total) by mouth 2 (two) times daily with a meal. Patient not taking: Reported on 08/25/2016 06/19/16   Hosie Poisson, MD  gabapentin (NEURONTIN) 300 MG capsule Take 300 mg by mouth 2 (two) times daily. 08/25/16   Historical Provider, MD  HYDROcodone-acetaminophen (NORCO/VICODIN) 5-325 MG tablet Take 0.5-1 tablets by mouth every 6 (six) hours as needed for moderate pain or severe pain. 08/25/16   Waynetta Pean, PA-C  senna (SENOKOT) 8.6 MG TABS tablet Take 1 tablet (8.6 mg total) by mouth daily as needed for mild constipation. Patient not taking: Reported on 08/25/2016 06/19/16   Hosie Poisson, MD    Family History Family History  Problem Relation Age of Onset  . Asthma    . Heart disease Mother   . Cancer Father   . Pancreatic cancer      Social History Social History  Substance Use Topics  . Smoking status: Current Some Day Smoker    Types: Pipe  . Smokeless tobacco: Never Used     Comment: smoking 1/2 ppd (04/20/13) - smokes 1 pipe per day x19yr 12/18/14  . Alcohol use No     Allergies   Atorvastatin; Albuterol sulfate; and Hydrocodone-acetaminophen   Review of Systems Review of Systems  Constitutional: Negative for chills and fever.  HENT: Negative for nosebleeds.   Eyes: Negative for visual disturbance.  Respiratory: Negative for cough and shortness of breath.   Cardiovascular: Negative for chest pain and palpitations.  Gastrointestinal: Negative for abdominal pain, nausea and vomiting.  Genitourinary: Negative for dysuria.  Musculoskeletal: Positive for arthralgias. Negative for back pain and neck pain.  Skin: Negative for rash.  Neurological: Negative for dizziness,  seizures, syncope, weakness, light-headedness, numbness and headaches.     Physical Exam Updated Vital Signs BP 147/55 (BP Location: Left Arm)   Pulse 79   Temp 98.6 F (37 C) (Oral)   Resp 18   Ht '5\' 5"'$  (1.651 m)   Wt 59 kg   SpO2 98%   BMI 21.63 kg/m   Physical Exam  Constitutional: She is oriented to person, place, and time. She appears well-developed and well-nourished. No distress.  HENT:  Head: Normocephalic and atraumatic.  Mouth/Throat: Oropharynx is clear and moist.  Eyes: Conjunctivae and EOM are normal. Pupils are equal, round, and reactive to light. Right eye exhibits no discharge. Left eye exhibits no discharge.  Neck: Neck supple.  No midline neck tenderness.  Cardiovascular: Normal rate, regular rhythm, normal heart sounds and intact distal pulses.   Bilateral radial pulses are intact. Good capillary refill to her bilateral distal fingertips.  Pulmonary/Chest: Effort normal and breath sounds normal. No  respiratory distress. She has no wheezes. She has no rales.  Lungs are clear to auscultation bilaterally. Symmetric chest expansion bilaterally.  Abdominal: Soft. There is no tenderness.  Musculoskeletal: She exhibits edema and tenderness.  Soft tissue swelling noted to her right clavicle area. Patient also has some mild tenderness over her right lateral shoulder. There is a small skin tear to her elbow. No elbow tenderness to palpation. No wrist tenderness to palpation. Good grip strengths bilaterally.  Lymphadenopathy:    She has no cervical adenopathy.  Neurological: She is alert and oriented to person, place, and time. No cranial nerve deficit. Coordination normal.  Skin: Skin is warm and dry. Capillary refill takes less than 2 seconds. No rash noted. She is not diaphoretic. No erythema. No pallor.  Psychiatric: She has a normal mood and affect. Her behavior is normal.  Nursing note and vitals reviewed.    ED Treatments / Results  Labs (all labs ordered are  listed, but only abnormal results are displayed) Labs Reviewed - No data to display  EKG  EKG Interpretation None       Radiology Dg Clavicle Right  Result Date: 08/25/2016 CLINICAL DATA:  Fall out of chair today. Right clavicle injury and pain. Initial encounter. EXAM: RIGHT CLAVICLE - 2+ VIEWS COMPARISON:  None. FINDINGS: Comminuted fracture of the right midclavicle is seen with mild inferior displacement of the distal fracture fragment. AC joint appears intact. IMPRESSION: Mildly displaced right midclavicle fracture. Electronically Signed   By: Earle Gell M.D.   On: 08/25/2016 16:03   Dg Shoulder Right  Result Date: 08/25/2016 CLINICAL DATA:  Fall out of chair today. Right shoulder injury and pain. Initial encounter. EXAM: RIGHT SHOULDER - 2+ VIEW COMPARISON:  Right clavicle radiographs also obtained today FINDINGS: Generalized osteopenia noted. Comminuted fracture of right mid clavicle noted. No other fracture seen involving the shoulder. No evidence of shoulder dislocation. IMPRESSION: Right midclavicle fracture. No other shoulder fracture or shoulder dislocation. Electronically Signed   By: Earle Gell M.D.   On: 08/25/2016 16:07    Procedures Procedures (including critical care time)  Medications Ordered in ED Medications  morphine 4 MG/ML injection 4 mg (4 mg Intravenous Canceled Entry 08/25/16 1634)  ondansetron (ZOFRAN) injection 4 mg (4 mg Intravenous Given 08/25/16 1634)  morphine 4 MG/ML injection 4 mg (4 mg Intravenous Given 08/25/16 1655)     Initial Impression / Assessment and Plan / ED Course  I have reviewed the triage vital signs and the nursing notes.  Pertinent labs & imaging results that were available during my care of the patient were reviewed by me and considered in my medical decision making (see chart for details).  Clinical Course   This is a 70 y.o. Female who presents to the ED via EMS after a trip and fall prior to arrival. Patient reports she  was standing up to walk outside and slipped with her walker falling on her right shoulder. She complains of pain to her right clavicle area and feels she cannot move her right shoulder. She denies hitting her head or LOC. She denies neck or back pain. She denies prodromal symptoms prior to falling and states that she tripped. She is not on anticoagulants.  On exam the patient is afebrile and nontoxic. She is edema in the right clavicle area. No open wound. She has mild tenderness over the lateral aspect of her right shoulder as well. No bony point tenderness to her humerus, elbow or wrist. She is neurovascularly  intact. X-ray of her right clavicle showed mildly displaced right midclavicle fracture. Right shoulder x-ray shows no shoulder fracture or shoulder dislocation. Patient placed in a shoulder sling. Will have her follow-up with her orthopedic surgeon Dr. Erlinda Hong and primary care. Son also is requesting additional help with her at home. Case management was consulted and is working on set up for home health. I discussed return precautions. I advised to return to the emergency department with new or worsening symptoms or new concerns. The patient and the patient's son verbalized understanding and agreement with plan.  This patient was discussed with and evaluated by Dr. Johnney Killian who agrees with assessment and plan.   Final Clinical Impressions(s) / ED Diagnoses   Final diagnoses:  Closed displaced fracture of shaft of right clavicle, initial encounter  Fall, initial encounter    New Prescriptions Current Discharge Medication List       Waynetta Pean, PA-C 08/25/16 1820    Charlesetta Shanks, MD 08/28/16 1400

## 2016-08-29 ENCOUNTER — Other Ambulatory Visit: Payer: Self-pay | Admitting: Adult Health

## 2016-09-10 ENCOUNTER — Encounter (INDEPENDENT_AMBULATORY_CARE_PROVIDER_SITE_OTHER): Payer: Self-pay | Admitting: Orthopaedic Surgery

## 2016-09-10 ENCOUNTER — Ambulatory Visit (INDEPENDENT_AMBULATORY_CARE_PROVIDER_SITE_OTHER): Payer: Medicare Other | Admitting: Orthopaedic Surgery

## 2016-09-10 ENCOUNTER — Ambulatory Visit (INDEPENDENT_AMBULATORY_CARE_PROVIDER_SITE_OTHER): Payer: Medicare Other

## 2016-09-10 DIAGNOSIS — S42021A Displaced fracture of shaft of right clavicle, initial encounter for closed fracture: Secondary | ICD-10-CM | POA: Insufficient documentation

## 2016-09-10 DIAGNOSIS — M25552 Pain in left hip: Secondary | ICD-10-CM | POA: Diagnosis not present

## 2016-09-10 DIAGNOSIS — S72002D Fracture of unspecified part of neck of left femur, subsequent encounter for closed fracture with routine healing: Secondary | ICD-10-CM

## 2016-09-10 NOTE — Progress Notes (Signed)
Office Visit Note   Patient: Karen Coffey           Date of Birth: 10/17/1946           MRN: [id number] Visit Date: 09/10/2016              Requested by: Maryruth Eve, MD [place], [place] [id number] PCP: Maryruth Eve, MD   Assessment & Plan: Visit Diagnoses:  1. Closed fracture of left hip with routine healing, subsequent encounter   2. Pain in left hip   3. Disp fx of shaft of right clavicle, init for clos fx     Plan:  - hip is doing well and healed - may begin to wean the sling and begin pendulum and gentle ROM with PT - patient's dementia has really declined since I last saw her - f/u 4 weeks with repeat clavicle xrays  Follow-Up Instructions: Return in about 4 weeks (around 10/08/2016) for recheck right clavicle fx.   Orders:  Orders Placed This Encounter  Procedures  . XR HIP UNILAT W OR W/O PELVIS 2-3 VIEWS LEFT  . XR Clavicle Right   No orders of the defined types were placed in this encounter.     Procedures: No procedures performed   Clinical Data: No additional findings.   Subjective: Chief Complaint  Patient presents with  . Left Hip - Routine Post Op, Follow-up    12 WEEKS PO    3 month f/u for left hip fracture and new problem of right clavicle fracture from 2 weeks ago.  Doing well from hip fracture.  She had mechanical fall and sustained right clavicle fracture.  Seen in the ER and is following up today.  Pain is moderate from the clavicle fx.  Has been in a wheelchair since the clavicle fx.  Her dementia has really deteriorated since the clavicle fx per the son.  She's at a SNF.      Review of Systems  Constitutional: Negative.   HENT: Negative.   Eyes: Negative.   Respiratory: Negative.   Cardiovascular: Negative.   Endocrine: Negative.   Musculoskeletal: Negative.   Neurological: Negative.   Hematological: Negative.   Psychiatric/Behavioral: Negative.   All other systems reviewed and  are negative.    Objective: Vital Signs: There were no vitals taken for this visit.  Physical Exam  Constitutional: She is oriented to person, place, and time. She appears well-developed and well-nourished.  HENT:  Head: Atraumatic.  Eyes: EOM are normal.  Neck: Neck supple.  Cardiovascular: Intact distal pulses.   Pulmonary/Chest: Effort normal.  Abdominal: Soft.  Neurological: She is alert and oriented to person, place, and time.  Skin: Skin is warm. Capillary refill takes less than 2 seconds.  Psychiatric: She has a normal mood and affect. Her behavior is normal. Judgment and thought content normal.  Nursing note and vitals reviewed.   Right Shoulder Exam   Tenderness  The patient is experiencing tenderness in the clavicle.  Other  Sensation: normal Pulse: present  Comments:  Skin intact. NVI distally.        Specialty Comments:  No specialty comments available.  Imaging: Xr Hip Unilat W Or W/o Pelvis 2-3 Views Left  Result Date: 09/10/2016 Healed left intertroch hip fx.  Stable hardware  Xr Clavicle Right  Result Date: 09/10/2016 Stable displaced right clavicle fracture.    PMFS History: Patient Active Problem List   Diagnosis Date Noted  . Disp fx of shaft of right  clavicle, init for clos fx 09/10/2016  . Closed left hip fracture ([place]) 06/14/2016  . Normocytic anemia 06/14/2016  . Leukocytosis 06/14/2016  . Hip fracture ([place]) 06/14/2016  . Chronic obstructive pulmonary disease ([place])   . COPD exacerbation ([place]) 02/25/2015  . COPD, moderate ([place]) 12/18/2014  . COPD, severe ([place]) 03/26/2013  . Lung mass 11/02/2011  . Dyspnea 11/02/2011  . Cough 11/02/2011  . Tachycardia 11/02/2011  . Smoking 11/02/2011  . Cachexia ([place]) 11/02/2011  . Chest pain, musculoskeletal 11/02/2011   Past Medical History:  Diagnosis Date  . Acute blood loss anemia   . Closed intertrochanteric fracture of left hip ([place]) 05/2016  . COPD (chronic obstructive pulmonary  disease) ([place])   . Leukocytosis 05/2016  . Lung mass   . Pleurisy 2008   left lung  . Tachycardia   . UTI (lower urinary tract infection)     Family History  Problem Relation Age of Onset  . Asthma    . Heart disease Mother   . Cancer Father   . Pancreatic cancer      Past Surgical History:  Procedure Laterality Date  . INTRAMEDULLARY (IM) NAIL INTERTROCHANTERIC Left 06/15/2016   Procedure: INTRAMEDULLARY (IM) NAIL INTERTROCHANTRIC FRACTURE LEFT;  Surgeon: Leandrew Koyanagi, MD;  Location: [facility];  Service: Orthopedics;  Laterality: Left;  . TUBAL LIGATION  1986   Social History   Occupational History  . Not on file.   Social History Main Topics  . Smoking status: Current Some Day Smoker    Types: Pipe  . Smokeless tobacco: Never Used     Comment: smoking 1/2 ppd (04/20/13) - smokes 1 pipe per day x83yr 12/18/14  . Alcohol use No  . Drug use: No  . Sexual activity: Not on file

## 2016-09-17 ENCOUNTER — Other Ambulatory Visit (HOSPITAL_COMMUNITY): Payer: Self-pay | Admitting: Internal Medicine

## 2016-09-17 DIAGNOSIS — R918 Other nonspecific abnormal finding of lung field: Secondary | ICD-10-CM

## 2016-09-21 ENCOUNTER — Encounter (HOSPITAL_COMMUNITY): Payer: Self-pay

## 2016-09-21 ENCOUNTER — Ambulatory Visit (HOSPITAL_COMMUNITY)
Admission: RE | Admit: 2016-09-21 | Discharge: 2016-09-21 | Disposition: A | Discharge status: Home / Self Care | Payer: Medicare Other | Source: Ambulatory Visit | Attending: Internal Medicine | Admitting: Internal Medicine

## 2016-09-21 DIAGNOSIS — Z85118 Personal history of other malignant neoplasm of bronchus and lung: Secondary | ICD-10-CM | POA: Insufficient documentation

## 2016-09-21 DIAGNOSIS — X58XXXA Exposure to other specified factors, initial encounter: Secondary | ICD-10-CM | POA: Diagnosis not present

## 2016-09-21 DIAGNOSIS — J439 Emphysema, unspecified: Secondary | ICD-10-CM | POA: Diagnosis not present

## 2016-09-21 DIAGNOSIS — R918 Other nonspecific abnormal finding of lung field: Secondary | ICD-10-CM | POA: Diagnosis not present

## 2016-09-21 DIAGNOSIS — S42021A Displaced fracture of shaft of right clavicle, initial encounter for closed fracture: Secondary | ICD-10-CM | POA: Insufficient documentation

## 2016-09-21 LAB — POCT I-STAT CREATININE: CREATININE: 0.9 mg/dL (ref 0.44–1.00)

## 2016-09-21 MED ORDER — IOPAMIDOL (ISOVUE-300) INJECTION 61%
75.0000 mL | Freq: Once | INTRAVENOUS | Status: AC | PRN
  Administered 2016-09-21: 75 mL via INTRAVENOUS

## 2016-10-11 ENCOUNTER — Ambulatory Visit (INDEPENDENT_AMBULATORY_CARE_PROVIDER_SITE_OTHER): Payer: Medicare Other | Admitting: Orthopaedic Surgery

## 2018-02-14 IMAGING — CR DG SHOULDER 2+V*R*
2 series · 2 of 2 positions shown · non-contrast
Comparison: Right clavicle radiographs also obtained today

CLINICAL DATA: Fall out of chair today. Right shoulder injury and
pain. Initial encounter.

EXAM:
RIGHT SHOULDER - 2+ VIEW

[shoulder grashey]
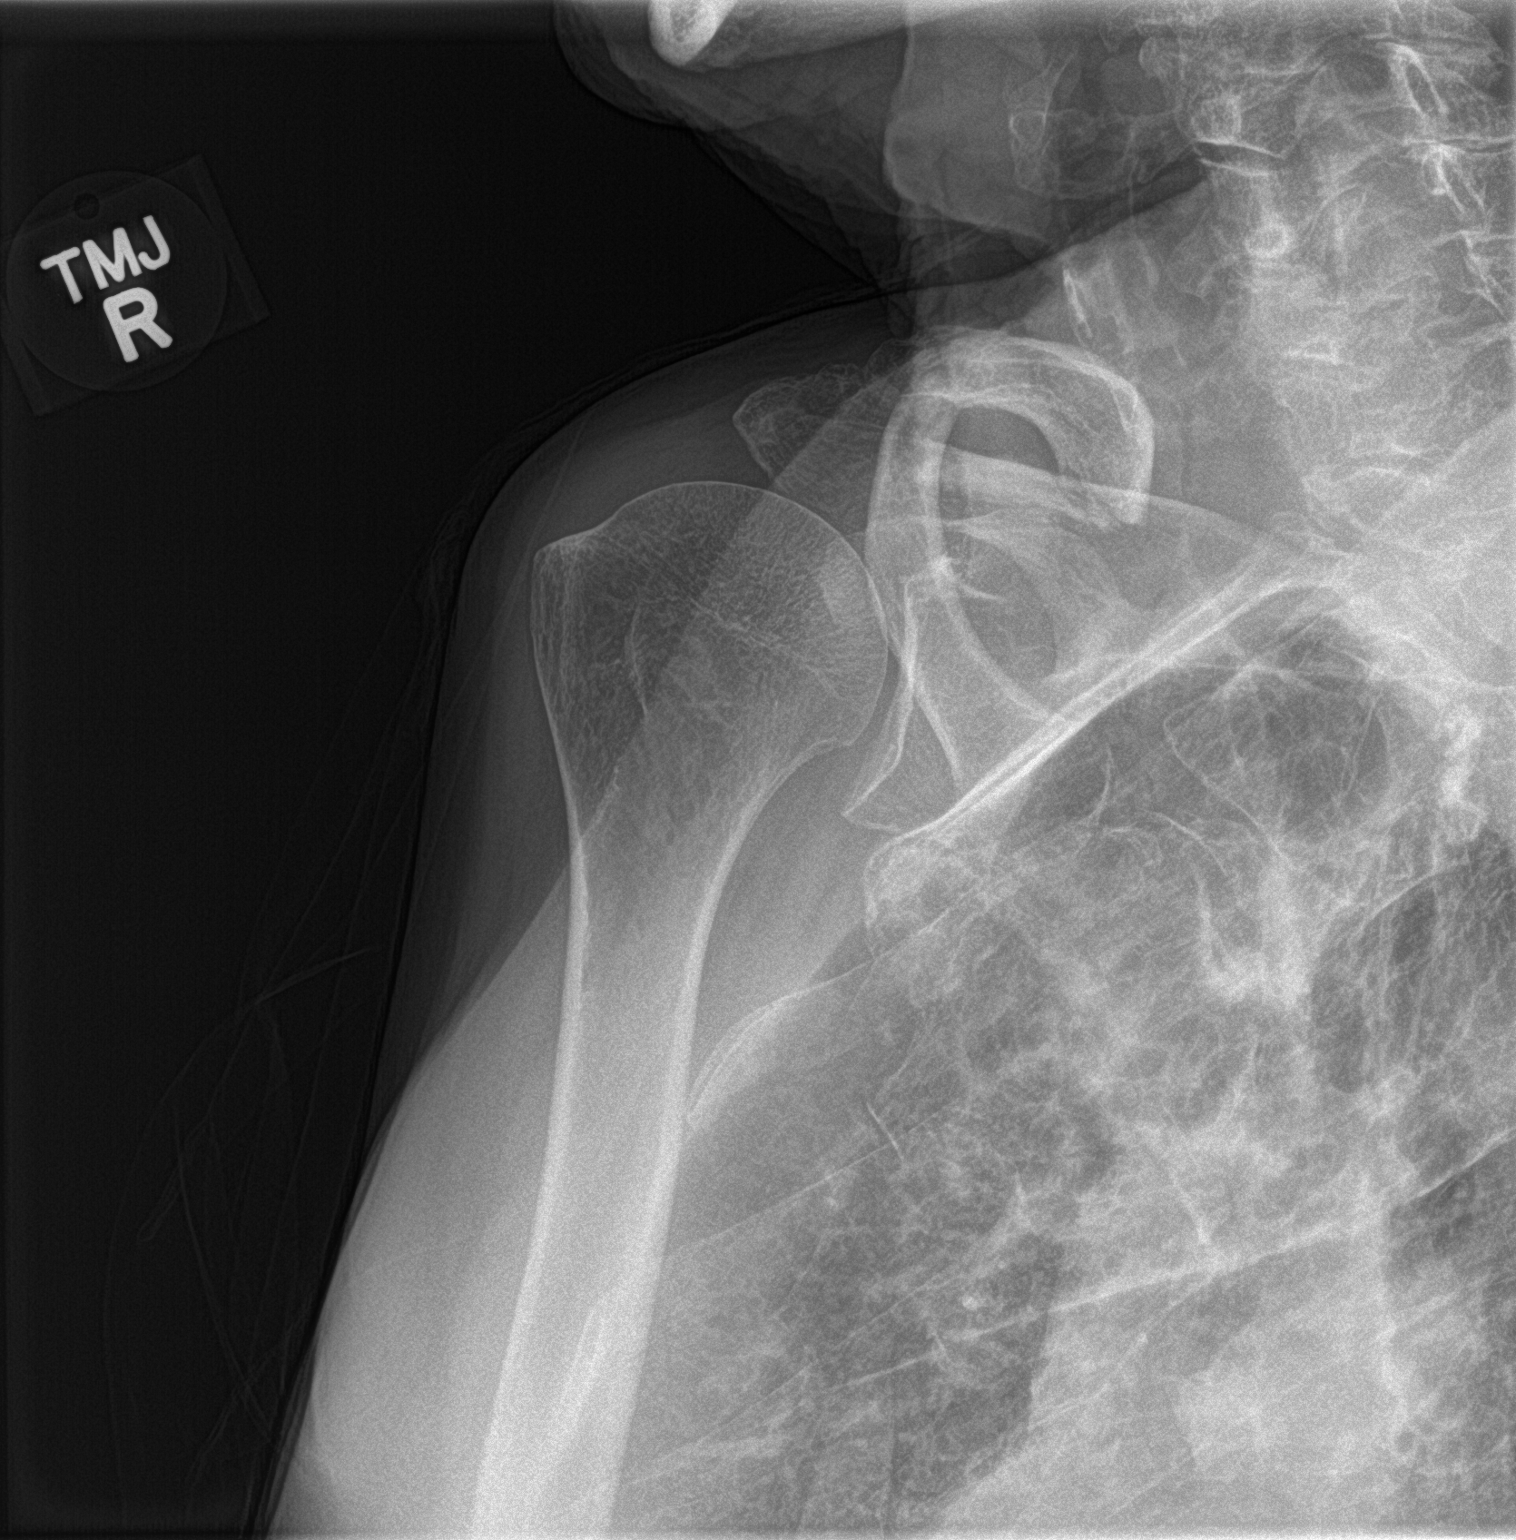

[shoulder y view]
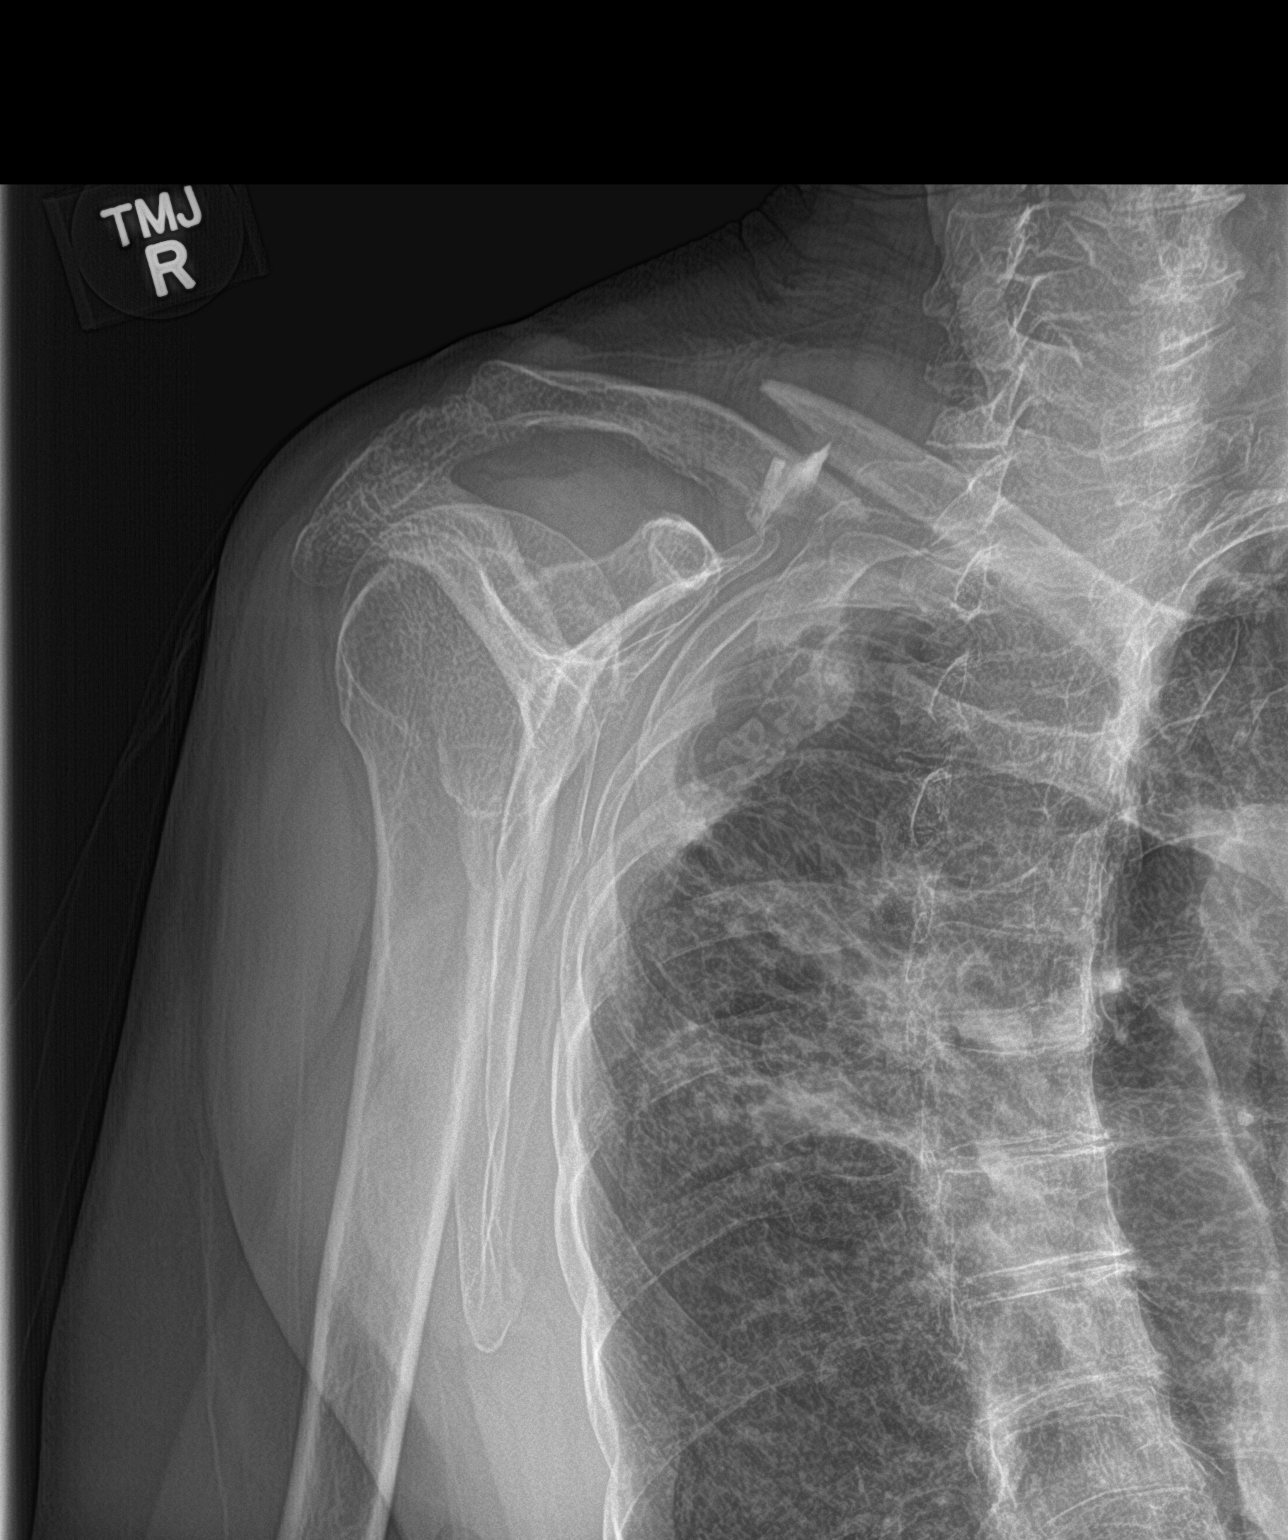

[2 of 2 positions shown; findings below may reference images not displayed]

FINDINGS: Generalized osteopenia noted. Comminuted fracture of right mid
clavicle noted. No other fracture seen involving the shoulder. No
evidence of shoulder dislocation.
IMPRESSION: Right midclavicle fracture. No other shoulder fracture or shoulder
dislocation.

## 2018-07-20 ENCOUNTER — Institutional Professional Consult (permissible substitution): Payer: Medicare Other | Admitting: Internal Medicine

## 2022-10-15 DEATH — deceased
